# Patient Record
Sex: Female | Born: 1945 | Race: White | Hispanic: No | Marital: Married | Smoking: Former smoker
Health system: Southern US, Community
[De-identification: ages and names within clinical notes are randomized; demographics above are authoritative.]

## PROBLEM LIST (undated history)

## (undated) DIAGNOSIS — I499 Cardiac arrhythmia, unspecified: Secondary | ICD-10-CM

## (undated) DIAGNOSIS — I639 Cerebral infarction, unspecified: Secondary | ICD-10-CM

## (undated) HISTORY — PX: HERNIA REPAIR: SHX51

---

## 2017-12-24 DIAGNOSIS — I482 Chronic atrial fibrillation, unspecified: Secondary | ICD-10-CM | POA: Diagnosis present

## 2018-07-08 ENCOUNTER — Telehealth: Payer: Self-pay

## 2018-07-08 NOTE — Telephone Encounter (Signed)
I am not taking new patients,

## 2018-07-08 NOTE — Telephone Encounter (Signed)
Patient is requesting you for to become her primary care provider. She is new to the area and needs some referrals for occupational therapy.

## 2018-07-12 ENCOUNTER — Ambulatory Visit (INDEPENDENT_AMBULATORY_CARE_PROVIDER_SITE_OTHER): Payer: Medicare Other | Admitting: Physician Assistant

## 2018-07-12 ENCOUNTER — Encounter: Payer: Self-pay | Admitting: Physician Assistant

## 2018-07-12 NOTE — Progress Notes (Signed)
Patient was checked in virtually, name and DOB verified, history taken. In the course of the history, patient reports she sees Dr. Baldemar Lenis for her primary care and intends to keep seeing him, has an appointment scheduled on 09/13/2018. Asked why she wants to establish here and she says because she needs an occupational therapy evaluation so she can be cleared to drive because her neurologist told her she could not. Informed patient this is not an occupational therapy clinic and she'll need to contact her PCP. Confirmed again that patient wishes to continue to see Dr. Baldemar Lenis and she confirms she does. We have mutually decided we will not establish care relationship and she will need to follow up with Dr. Baldemar Lenis for chronic medical conditions.

## 2018-09-13 DIAGNOSIS — I10 Essential (primary) hypertension: Secondary | ICD-10-CM | POA: Diagnosis present

## 2021-03-26 ENCOUNTER — Encounter: Payer: Self-pay | Admitting: Gastroenterology

## 2021-03-26 NOTE — H&P (Signed)
Pre-Procedure H&P   Patient ID: Erica Waters is a 76 y.o. female.  Gastroenterology Provider: Annamaria Helling, DO  PCP: Derinda Late, MD  Date: 03/27/2021  HPI Ms. Erica Waters is a 76 y.o. female who presents today for Esophagogastroduodenoscopy and Colonoscopy for Epigastric pain, reflux, barretts screening; positive Cologuard.  Patient on Eliquis (3d) and cilostazol (5d) currently being held for these procedures.  Patient deals with chronic constipation and feelings of incomplete evacuation for which she uses Dulcolax with good relief.  She has suprapubic discomfort which becomes better with having a bowel movement.  She denies any visible blood or melena with her stools.  She did have a positive Cologuard prior to this procedure.  Patient was also started on MiraLAX during her office visit which has improved her bowel movements, but still with bloating.  Patient currently has reflux that she has managed in the past with Pepcid.  This started to lose effectiveness and she was started on Protonix when seen in the office which has helped her reflux.  She also notes epigastric pain. Denies dysphagia and odynophagia. Describes spasm at the suprasternal notch, but not necessarily associated with eating or drinking and affects her breathing more than her swallowing. Denies nausea and vomiting.  Last checked hemoglobin 13.7 MCV 89 platelets 198,000.  No family history of colorectal polyps or colorectal cancer.  Past Medical History:  Diagnosis Date   Dysrhythmia    afib   Stroke [facility])     Past Surgical History:  Procedure Laterality Date   HERNIA REPAIR     umbilical    Family History No h/o GI disease or malignancy  Review of Systems  Constitutional:  Negative for activity change, appetite change, chills, diaphoresis, fatigue, fever and unexpected weight change.  HENT:  Negative for trouble swallowing and voice change.   Respiratory:  Negative for shortness  of breath and wheezing.   Cardiovascular:  Negative for chest pain, palpitations and leg swelling.  Gastrointestinal:  Positive for abdominal pain and constipation. Negative for abdominal distention, anal bleeding, blood in stool, diarrhea, nausea, rectal pain and vomiting.       Reflux  Musculoskeletal:  Negative for arthralgias and myalgias.  Skin:  Negative for color change and pallor.  Neurological:  Negative for dizziness, syncope and weakness.  Psychiatric/Behavioral:  Negative for confusion.   All other systems reviewed and are negative.   Medications No current facility-administered medications on file prior to encounter.   Current Outpatient Medications on File Prior to Encounter  Medication Sig Dispense Refill   famotidine (PEPCID) 20 MG tablet Take 20 mg by mouth 2 (two) times daily.     lisinopril (ZESTRIL) 20 MG tablet Take by mouth.     metoprolol succinate (TOPROL-XL) 25 MG 24 hr tablet Take 12.5 mg by mouth daily.     simvastatin (ZOCOR) 10 MG tablet Take by mouth.     apixaban (ELIQUIS) 5 MG TABS tablet Take by mouth.     BLACK CURRANT SEED OIL PO Take by mouth. (Patient not taking: Reported on 03/27/2021)     cilostazol (PLETAL) 50 MG tablet Take by mouth.     ezetimibe (ZETIA) 10 MG tablet TAKE 1 TABLET BY MOUTH ONCE DAILY FOR 30 DAYS (Patient not taking: Reported on 03/27/2021)     PARoxetine (PAXIL) 20 MG tablet Take by mouth.     vitamin B-12 (CYANOCOBALAMIN) 1000 MCG tablet Take by mouth. (Patient not taking: Reported on 03/27/2021)  Pertinent medications related to GI and procedure were reviewed by me with the patient prior to the procedure   Current Facility-Administered Medications:    0.9 %  sodium chloride infusion, , Intravenous, Continuous, Annamaria Helling, DO, Last Rate: 20 mL/hr at 03/27/21 0830, New Bag at 03/27/21 0830      Allergies  Allergen Reactions   Penicillin G Hives and Shortness Of Breath   Allergies were reviewed by me prior to  the procedure  Objective    Vitals:   03/27/21 0811  BP: (!) 172/77  Pulse: 66  Resp: 18  Temp: (!) 97.2 F (36.2 C)  TempSrc: Temporal  SpO2: 97%  Weight: 90.7 kg  Height: 5\' 3"  (1.6 m)     Physical Exam Vitals and nursing note reviewed.  Constitutional:      General: She is not in acute distress.    Appearance: Normal appearance. She is not ill-appearing, toxic-appearing or diaphoretic.  HENT:     Head: Normocephalic and atraumatic.     Nose: Nose normal.     Mouth/Throat:     Mouth: Mucous membranes are moist.     Pharynx: Oropharynx is clear.  Eyes:     General: No scleral icterus.    Extraocular Movements: Extraocular movements intact.  Cardiovascular:     Rate and Rhythm: Normal rate and regular rhythm.     Heart sounds: Normal heart sounds. No murmur heard.   No friction rub. No gallop.  Pulmonary:     Effort: Pulmonary effort is normal. No respiratory distress.     Breath sounds: Normal breath sounds. No wheezing, rhonchi or rales.  Abdominal:     General: Bowel sounds are normal. There is no distension.     Palpations: Abdomen is soft.     Tenderness: There is no abdominal tenderness. There is no guarding or rebound.  Musculoskeletal:     Cervical back: Neck supple.     Right lower leg: No edema.     Left lower leg: No edema.  Skin:    General: Skin is warm and dry.     Coloration: Skin is not jaundiced or pale.  Neurological:     General: No focal deficit present.     Mental Status: She is alert and oriented to person, place, and time. Mental status is at baseline.  Psychiatric:        Mood and Affect: Mood normal.        Behavior: Behavior normal.        Thought Content: Thought content normal.        Judgment: Judgment normal.     Assessment:  Erica Waters is a 76 y.o. female  who presents today for Esophagogastroduodenoscopy and Colonoscopy for Epigastric pain, reflux, barretts screening; positive Cologuard.  Plan:   Esophagogastroduodenoscopy and Colonoscopy with possible intervention today  Esophagogastroduodenoscopy and Colonoscopy with possible biopsy, control of bleeding, polypectomy, and interventions as necessary has been discussed with the patient/patient representative. Informed consent was obtained from the patient/patient representative after explaining the indication, nature, and risks of the procedure including but not limited to death, bleeding, perforation, missed neoplasm/lesions, cardiorespiratory compromise, and reaction to medications. Opportunity for questions was given and appropriate answers were provided. Patient/patient representative has verbalized understanding is amenable to undergoing the procedure.   Annamaria Helling, DO  [facility] Gastroenterology  Portions of the record may have been created with voice recognition software. Occasional wrong-word or 'sound-a-like' substitutions may have occurred due to the  inherent limitations of voice recognition software.  Read the chart carefully and recognize, using context, where substitutions may have occurred.

## 2021-03-27 ENCOUNTER — Ambulatory Visit
Admission: RE | Admit: 2021-03-27 | Discharge: 2021-03-27 | Disposition: A | Discharge status: Home / Self Care | Payer: Medicare Other | Attending: Gastroenterology | Admitting: Gastroenterology

## 2021-03-27 ENCOUNTER — Ambulatory Visit: Payer: Medicare Other | Admitting: Certified Registered"

## 2021-03-27 ENCOUNTER — Other Ambulatory Visit: Payer: Self-pay

## 2021-03-27 ENCOUNTER — Encounter
Admission: RE | Disposition: A | Discharge status: Home / Self Care | Payer: Self-pay | Source: Home / Self Care | Attending: Gastroenterology

## 2021-03-27 ENCOUNTER — Encounter: Payer: Self-pay | Admitting: Gastroenterology

## 2021-03-27 DIAGNOSIS — D124 Benign neoplasm of descending colon: Secondary | ICD-10-CM | POA: Diagnosis not present

## 2021-03-27 DIAGNOSIS — K5730 Diverticulosis of large intestine without perforation or abscess without bleeding: Secondary | ICD-10-CM | POA: Diagnosis not present

## 2021-03-27 DIAGNOSIS — Z8673 Personal history of transient ischemic attack (TIA), and cerebral infarction without residual deficits: Secondary | ICD-10-CM | POA: Insufficient documentation

## 2021-03-27 DIAGNOSIS — I4891 Unspecified atrial fibrillation: Secondary | ICD-10-CM | POA: Insufficient documentation

## 2021-03-27 DIAGNOSIS — K2289 Other specified disease of esophagus: Secondary | ICD-10-CM | POA: Diagnosis not present

## 2021-03-27 DIAGNOSIS — K621 Rectal polyp: Secondary | ICD-10-CM | POA: Diagnosis not present

## 2021-03-27 DIAGNOSIS — K219 Gastro-esophageal reflux disease without esophagitis: Secondary | ICD-10-CM | POA: Diagnosis not present

## 2021-03-27 DIAGNOSIS — Z942 Lung transplant status: Secondary | ICD-10-CM | POA: Diagnosis not present

## 2021-03-27 DIAGNOSIS — Z7901 Long term (current) use of anticoagulants: Secondary | ICD-10-CM | POA: Insufficient documentation

## 2021-03-27 DIAGNOSIS — K319 Disease of stomach and duodenum, unspecified: Secondary | ICD-10-CM | POA: Insufficient documentation

## 2021-03-27 DIAGNOSIS — Z79899 Other long term (current) drug therapy: Secondary | ICD-10-CM | POA: Insufficient documentation

## 2021-03-27 DIAGNOSIS — K644 Residual hemorrhoidal skin tags: Secondary | ICD-10-CM | POA: Insufficient documentation

## 2021-03-27 DIAGNOSIS — K5909 Other constipation: Secondary | ICD-10-CM | POA: Diagnosis not present

## 2021-03-27 DIAGNOSIS — K2970 Gastritis, unspecified, without bleeding: Secondary | ICD-10-CM | POA: Diagnosis not present

## 2021-03-27 DIAGNOSIS — R195 Other fecal abnormalities: Secondary | ICD-10-CM | POA: Insufficient documentation

## 2021-03-27 DIAGNOSIS — K648 Other hemorrhoids: Secondary | ICD-10-CM | POA: Diagnosis not present

## 2021-03-27 HISTORY — DX: Cerebral infarction, unspecified: I63.9

## 2021-03-27 HISTORY — PX: ESOPHAGOGASTRODUODENOSCOPY: SHX5428

## 2021-03-27 HISTORY — PX: COLONOSCOPY: SHX5424

## 2021-03-27 HISTORY — DX: Cardiac arrhythmia, unspecified: I49.9

## 2021-03-27 SURGERY — COLONOSCOPY
Anesthesia: General

## 2021-03-27 MED ORDER — LIDOCAINE 2% (20 MG/ML) 5 ML SYRINGE
INTRAMUSCULAR | Status: DC | PRN
  Administered 2021-03-27: 20 mg via INTRAVENOUS

## 2021-03-27 MED ORDER — PROPOFOL 500 MG/50ML IV EMUL
INTRAVENOUS | Status: AC
  Filled 2021-03-27: qty 50

## 2021-03-27 MED ORDER — PROPOFOL 10 MG/ML IV BOLUS
INTRAVENOUS | Status: DC | PRN
Start: 2021-03-27 — End: 2021-03-27
  Administered 2021-03-27: 30 mg via INTRAVENOUS
  Administered 2021-03-27: 70 mg via INTRAVENOUS

## 2021-03-27 MED ORDER — SODIUM CHLORIDE 0.9 % IV SOLN: INTRAVENOUS | Status: DC

## 2021-03-27 MED ORDER — PROPOFOL 500 MG/50ML IV EMUL
INTRAVENOUS | Status: DC | PRN
  Administered 2021-03-27: 120 ug/kg/min via INTRAVENOUS

## 2021-03-27 MED ORDER — GLYCOPYRROLATE 0.2 MG/ML IJ SOLN
INTRAMUSCULAR | Status: DC | PRN
Start: 2021-03-27 — End: 2021-03-27
  Administered 2021-03-27: .2 mg via INTRAVENOUS

## 2021-03-27 NOTE — Interval H&P Note (Signed)
History and Physical Interval Note: Preprocedure H&P from 03/27/21  was reviewed and there was no interval change after seeing and examining the patient.  Written consent was obtained from the patient after discussion of risks, benefits, and alternatives. 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  The various methods of treatment have been discussed with the patient and family. After consideration of risks, benefits and other options for treatment, the patient has consented to  Procedure(s): COLONOSCOPY (N/A) ESOPHAGOGASTRODUODENOSCOPY (EGD) (N/A) as a surgical intervention.  The patient's history has been reviewed, patient examined, no change in status, stable for surgery.  I have reviewed the patient's chart and labs.  Questions were answered to the patient's satisfaction.     Annamaria Helling

## 2021-03-27 NOTE — Op Note (Addendum)
[facility] Gastroenterology Patient Name: Erica Waters Procedure Date: 03/27/2021 9:26 AM MRN: [id number] Account #: [id number] Date of Birth: Mar 14, 1945 Admit Type: Outpatient Age: 76 Room: [facility] ENDO ROOM 1 Gender: Female Note Status: Finalized Instrument Name: Colonoscope [id number] Procedure:             Colonoscopy Indications:           Positive Cologuard test Providers:             Annamaria Helling DO, DO Referring MD:          Caprice Renshaw MD (Referring MD) Medicines:             Monitored Anesthesia Care Complications:         No immediate complications. Estimated blood loss:                         Minimal. Procedure:             Pre-Anesthesia Assessment:                        - Prior to the procedure, a History and Physical was                         performed, and patient medications and allergies were                         reviewed. The patient is competent. The risks and                         benefits of the procedure and the sedation options and                         risks were discussed with the patient. All questions                         were answered and informed consent was obtained.                         Patient identification and proposed procedure were                         verified by the physician, the nurse, the anesthetist                         and the technician in the endoscopy suite. Mental                         Status Examination: alert and oriented. Airway                         Examination: normal oropharyngeal airway and neck                         mobility. Respiratory Examination: clear to                         auscultation. CV Examination: RRR, no murmurs, no S3  or S4. Prophylactic Antibiotics: The patient does not                         require prophylactic antibiotics. Prior                         Anticoagulants: The patient has taken Eliquis                          (apixaban), last dose was 3 days and Pletal 5 days                         prior to procedure. ASA Grade Assessment: III - A                         patient with severe systemic disease. After reviewing                         the risks and benefits, the patient was deemed in                         satisfactory condition to undergo the procedure. The                         anesthesia plan was to use monitored anesthesia care                         (MAC). Immediately prior to administration of                         medications, the patient was re-assessed for adequacy                         to receive sedatives. The heart rate, respiratory                         rate, oxygen saturations, blood pressure, adequacy of                         pulmonary ventilation, and response to care were                         monitored throughout the procedure. The physical                         status of the patient was re-assessed after the                         procedure.                        After obtaining informed consent, the colonoscope was                         passed under direct vision. Throughout the procedure,                         the patient's blood pressure, pulse, and oxygen  saturations were monitored continuously. The                         Colonoscope was introduced through the anus and                         advanced to the the cecum, identified by appendiceal                         orifice and ileocecal valve. The colonoscopy was                         somewhat difficult due to multiple diverticula in the                         colon, a redundant colon, significant looping and the                         patient's body habitus. Successful completion of the                         procedure was aided by changing the patient to a                         supine position, changing the patient to a prone                         position, using  manual pressure, straightening and                         shortening the scope to obtain bowel loop reduction,                         using scope torsion, applying abdominal pressure and                         lavage. The patient tolerated the procedure well. The                         quality of the bowel preparation was evaluated using                         the BBPS [facility] Bowel Preparation Scale) with scores                         of: Right Colon = 3 (entire mucosa seen well with no                         residual staining, small fragments of stool or opaque                         liquid), Transverse Colon = 2 (minor amount of                         residual staining, small fragments of stool and/or  opaque liquid, but mucosa seen well) and Left Colon =                         2 (minor amount of residual staining, small fragments                         of stool and/or opaque liquid, but mucosa seen well).                         The total BBPS score equals 7. The quality of the                         bowel preparation was good. The ileocecal valve,                         appendiceal orifice, and rectum were photographed. Findings:      Hemorrhoids were found on perianal exam.      The digital rectal exam was normal. Pertinent negatives include normal       sphincter tone.      A 5 to 6 mm polyp was found in the descending colon. The polyp was       sessile. The polyp was removed with a cold snare. Resection and       retrieval were complete. Estimated blood loss was minimal.      Two sessile polyps were found in the rectum and descending colon. The       polyps were 1 to 2 mm in size. These polyps were removed with a cold       biopsy forceps. Resection and retrieval were complete. Estimated blood       loss was minimal.      Non-bleeding external and internal hemorrhoids were found during       retroflexion. Estimated blood loss: none.       Multiple small and large-mouthed diverticula were found in the entire       colon. Estimated blood loss: none.      The exam was otherwise without abnormality on direct and retroflexion       views. Impression:            - Hemorrhoids found on perianal exam.                        - One 5 to 6 mm polyp in the descending colon, removed                         with a cold snare. Resected and retrieved.                        - Two 1 to 2 mm polyps in the rectum and in the                         descending colon, removed with a cold biopsy forceps.                         Resected and retrieved.                        - Non-bleeding external and internal hemorrhoids.                        -  Diverticulosis in the entire examined colon.                        - The examination was otherwise normal on direct and                         retroflexion views. Recommendation:        - Discharge patient to home.                        - Resume previous diet.                        - Continue present medications.                        - Await pathology results.                        - Repeat colonoscopy for surveillance based on                         pathology results.                        - Return to GI office as previously scheduled.                        - Resume Eliquis (apixaban) in 2 days and Pletal                         (cilostazol) in 2 days at prior doses. Refer to                         referring physician for further adjustment of therapy. Procedure Code(s):     --- Professional ---                        [phone], Colonoscopy, flexible; with removal of                         tumor(s), polyp(s), or other lesion(s) by snare                         technique                        45380, 74, Colonoscopy, flexible; with biopsy, single                         or multiple Diagnosis Code(s):     --- Professional ---                        K64.8, Other hemorrhoids                         K63.5, Polyp of colon                        K62.1, Rectal polyp  R19.5, Other fecal abnormalities                        K57.30, Diverticulosis of large intestine without                         perforation or abscess without bleeding CPT copyright 2019 American Medical Association. All rights reserved. The codes documented in this report are preliminary and upon coder review may  be revised to meet current compliance requirements. Attending Participation:      I personally performed the entire procedure. Volney American, DO Annamaria Helling DO, DO 03/27/2021 10:43:38 AM This report has been signed electronically. Number of Addenda: 0 Note Initiated On: 03/27/2021 9:26 AM Scope Withdrawal Time: 0 hours 19 minutes 48 seconds  Total Procedure Duration: 0 hours 39 minutes 5 seconds  Estimated Blood Loss:  Estimated blood loss was minimal.      [facility]

## 2021-03-27 NOTE — Anesthesia Preprocedure Evaluation (Signed)
Anesthesia Evaluation  Patient identified by MRN, date of birth, ID band Patient awake    Reviewed: Allergy & Precautions, NPO status , Patient's Chart, lab work & pertinent test results  History of Anesthesia Complications Negative for: history of anesthetic complications  Airway Mallampati: III  TM Distance: >3 FB Neck ROM: Full    Dental  (+) Missing, Poor Dentition, Edentulous Upper   Pulmonary neg pulmonary ROS, neg sleep apnea, neg COPD, Patient abstained from smoking.Not current smoker, former smoker,    Pulmonary exam normal breath sounds clear to auscultation       Cardiovascular Exercise Tolerance: Good METS(-) hypertension(-) CAD and (-) Past MI + dysrhythmias Atrial Fibrillation  Rhythm:Regular Rate:Normal - Systolic murmurs    Neuro/Psych CVA, No Residual Symptoms negative psych ROS   GI/Hepatic neg GERD  ,(+)     (-) substance abuse  ,   Endo/Other  neg diabetes  Renal/GU negative Renal ROS     Musculoskeletal   Abdominal   Peds  Hematology   Anesthesia Other Findings Past Medical History: No date: Dysrhythmia     Comment:  afib No date: Stroke [facility])  Reproductive/Obstetrics                             Anesthesia Physical Anesthesia Plan  ASA: 3  Anesthesia Plan: General   Post-op Pain Management: Minimal or no pain anticipated   Induction: Intravenous  PONV Risk Score and Plan: 3 and Propofol infusion, TIVA and Ondansetron  Airway Management Planned: Nasal Cannula  Additional Equipment: None  Intra-op Plan:   Post-operative Plan:   Informed Consent: I have reviewed the patients History and Physical, chart, labs and discussed the procedure including the risks, benefits and alternatives for the proposed anesthesia with the patient or authorized representative who has indicated his/her understanding and acceptance.     Dental advisory given  Plan  Discussed with: CRNA and Surgeon  Anesthesia Plan Comments: (Discussed risks of anesthesia with patient, including possibility of difficulty with spontaneous ventilation under anesthesia necessitating airway intervention, PONV, and rare risks such as cardiac or respiratory or neurological events, and allergic reactions. Discussed the role of CRNA in patient's perioperative care. Patient understands.)        Anesthesia Quick Evaluation

## 2021-03-27 NOTE — Anesthesia Postprocedure Evaluation (Signed)
Anesthesia Post Note  Patient: Erica Waters  Procedure(s) Performed: COLONOSCOPY ESOPHAGOGASTRODUODENOSCOPY (EGD)  Patient location during evaluation: Endoscopy Anesthesia Type: General Level of consciousness: awake and alert Pain management: pain level controlled Vital Signs Assessment: post-procedure vital signs reviewed and stable Respiratory status: spontaneous breathing, nonlabored ventilation, respiratory function stable and patient connected to nasal cannula oxygen Cardiovascular status: blood pressure returned to baseline and stable Postop Assessment: no apparent nausea or vomiting Anesthetic complications: no   No notable events documented.   Last Vitals:  Vitals:   03/27/21 1053 03/27/21 1113  BP: 140/84 (!) 161/71  Pulse: 71 65  Resp: 19 15  Temp:    SpO2: 97% 99%    Last Pain:  Vitals:   03/27/21 1113  TempSrc:   PainSc: 0-No pain                 Arita Miss

## 2021-03-27 NOTE — Op Note (Addendum)
[facility] Patient Name: Erica Waters Procedure Date: 03/27/2021 9:26 AM MRN: [id number] Account #: [id number] Date of Birth: [birth year] Admit Type: Outpatient Age: 76 Room: [facility] ENDO ROOM 1 Gender: Female Note Status: Finalized Instrument Name: Upper Endoscope [id number] Procedure:             Upper GI endoscopy Indications:           Epigastric abdominal pain Providers:             Annamaria Helling DO, DO Referring MD:          Caprice Renshaw MD (Referring MD) Medicines:             Monitored Anesthesia Care Complications:         No immediate complications. Estimated blood loss:                         Minimal. Procedure:             Pre-Anesthesia Assessment:                        - Prior to the procedure, a History and Physical was                         performed, and patient medications and allergies were                         reviewed. The patient is competent. The risks and                         benefits of the procedure and the sedation options and                         risks were discussed with the patient. All questions                         were answered and informed consent was obtained.                         Patient identification and proposed procedure were                         verified by the physician, the nurse, the anesthetist                         and the technician in the endoscopy suite. Mental                         Status Examination: alert and oriented. Airway                         Examination: normal oropharyngeal airway and neck                         mobility. Respiratory Examination: clear to                         auscultation. CV Examination: RRR, no murmurs, no S3  or S4. Prophylactic Antibiotics: The patient does not                         require prophylactic antibiotics. Prior                         Anticoagulants: The patient has taken Eliquis                          (apixaban) and Pletal (cilastazol), last dose was 3                         days prior to procedure. ASA Grade Assessment: III - A                         patient with severe systemic disease. After reviewing                         the risks and benefits, the patient was deemed in                         satisfactory condition to undergo the procedure. The                         anesthesia plan was to use monitored anesthesia care                         (MAC). Immediately prior to administration of                         medications, the patient was re-assessed for adequacy                         to receive sedatives. The heart rate, respiratory                         rate, oxygen saturations, blood pressure, adequacy of                         pulmonary ventilation, and response to care were                         monitored throughout the procedure. The physical                         status of the patient was re-assessed after the                         procedure.                        After obtaining informed consent, the endoscope was                         passed under direct vision. Throughout the procedure,                         the patient's blood pressure, pulse, and oxygen  saturations were monitored continuously. The Endoscope                         was introduced through the mouth, and advanced to the                         second part of duodenum. The upper GI endoscopy was                         accomplished without difficulty. The patient tolerated                         the procedure well. Findings:      The duodenal bulb, first portion of the duodenum and second portion of       the duodenum were normal. Estimated blood loss: none.      Localized moderate inflammation characterized by erosions and erythema       was found on the lesser curvature of the stomach and in the gastric       antrum. Biopsies were taken with a  cold forceps for Helicobacter pylori       testing. Estimated blood loss was minimal. Estimated blood loss was       minimal.      The Z-line was irregular. Approximately 1cm tongue of salmon colored       mucosa extending above the z line. Biopsies were taken with a cold       forceps for histology. Estimated blood loss was minimal.      Esophagogastric landmarks were identified: the gastroesophageal junction       was found at 38 cm from the incisors.      Normal mucosa was found in the entire esophagus. The scope was       withdrawn. Dilation was performed with a Maloney dilator with no       resistance at 48 Fr. Estimated blood loss: none.      The exam of the esophagus was otherwise normal. Impression:            - Normal duodenal bulb, first portion of the duodenum                         and second portion of the duodenum.                        - Gastritis. Biopsied.                        - Z-line irregular. Biopsied.                        - Esophagogastric landmarks identified.                        - Normal mucosa was found in the entire esophagus.                         Dilated. Recommendation:        - Discharge patient to home.                        - Resume previous diet.                        -  Continue present medications.                        - Recommend increasing protonix 40 mg to twice a day.                        - No ibuprofen, naproxen, or other non-steroidal                         anti-inflammatory drugs.                        - Await pathology results.                        - Return to referring physician as previously                         scheduled. Procedure Code(s):     --- Professional ---                        [phone], Esophagogastroduodenoscopy, flexible,                         transoral; with biopsy, single or multiple                        [id number], Dilation of esophagus, by unguided sound or                         bougie, single or  multiple passes Diagnosis Code(s):     --- Professional ---                        K29.70, Gastritis, unspecified, without bleeding                        K22.8, Other specified diseases of esophagus                        R10.13, Epigastric pain CPT copyright 2019 [employer]. All rights reserved. The codes documented in this report are preliminary and upon coder review may  be revised to meet current compliance requirements. Attending Participation:      I personally performed the entire procedure. Volney American, DO Annamaria Helling DO, DO 03/27/2021 10:36:50 AM This report has been signed electronically. Number of Addenda: 0 Note Initiated On: 03/27/2021 9:26 AM Estimated Blood Loss:  Estimated blood loss was minimal.      [facility]

## 2021-03-27 NOTE — Transfer of Care (Signed)
Immediate Anesthesia Transfer of Care Note  Patient: Erica Waters  Procedure(s) Performed: COLONOSCOPY ESOPHAGOGASTRODUODENOSCOPY (EGD)  Patient Location: Endoscopy Unit  Anesthesia Type:General  Level of Consciousness: drowsy  Airway & Oxygen Therapy: Patient Spontanous Breathing  Post-op Assessment: Report given to RN and Post -op Vital signs reviewed and stable  Post vital signs: Reviewed  Last Vitals:  Vitals Value Taken Time  BP 106/51 03/27/21 1034  Temp    Pulse 71 03/27/21 1034  Resp 12 03/27/21 1034  SpO2 99 % 03/27/21 1034  Vitals shown include unvalidated device data.  Last Pain:  Vitals:   03/27/21 0811  TempSrc: Temporal  PainSc: 0-No pain         Complications: No notable events documented.

## 2021-03-28 ENCOUNTER — Encounter: Payer: Self-pay | Admitting: Gastroenterology

## 2021-03-28 LAB — SURGICAL PATHOLOGY

## 2021-05-16 ENCOUNTER — Other Ambulatory Visit: Payer: Self-pay | Admitting: Family Medicine

## 2021-05-16 DIAGNOSIS — Z1231 Encounter for screening mammogram for malignant neoplasm of breast: Secondary | ICD-10-CM

## 2021-06-23 ENCOUNTER — Encounter: Payer: Self-pay | Admitting: Radiology

## 2021-06-23 ENCOUNTER — Ambulatory Visit
Admission: RE | Admit: 2021-06-23 | Discharge: 2021-06-23 | Disposition: A | Discharge status: Home / Self Care | Payer: Medicare Other | Source: Ambulatory Visit | Attending: Family Medicine | Admitting: Family Medicine

## 2021-06-23 DIAGNOSIS — Z1231 Encounter for screening mammogram for malignant neoplasm of breast: Secondary | ICD-10-CM

## 2021-06-25 ENCOUNTER — Other Ambulatory Visit: Payer: Self-pay | Admitting: Family Medicine

## 2021-06-25 DIAGNOSIS — R928 Other abnormal and inconclusive findings on diagnostic imaging of breast: Secondary | ICD-10-CM

## 2021-06-25 DIAGNOSIS — N63 Unspecified lump in unspecified breast: Secondary | ICD-10-CM

## 2021-07-17 ENCOUNTER — Ambulatory Visit
Admission: RE | Admit: 2021-07-17 | Discharge: 2021-07-17 | Disposition: A | Discharge status: Home / Self Care | Payer: Medicare Other | Source: Ambulatory Visit | Attending: Family Medicine | Admitting: Family Medicine

## 2021-07-17 DIAGNOSIS — R928 Other abnormal and inconclusive findings on diagnostic imaging of breast: Secondary | ICD-10-CM | POA: Insufficient documentation

## 2021-07-17 DIAGNOSIS — N63 Unspecified lump in unspecified breast: Secondary | ICD-10-CM

## 2021-07-21 ENCOUNTER — Other Ambulatory Visit: Payer: Self-pay | Admitting: Family Medicine

## 2021-07-21 DIAGNOSIS — N63 Unspecified lump in unspecified breast: Secondary | ICD-10-CM

## 2021-07-21 DIAGNOSIS — R590 Localized enlarged lymph nodes: Secondary | ICD-10-CM

## 2021-07-21 DIAGNOSIS — R928 Other abnormal and inconclusive findings on diagnostic imaging of breast: Secondary | ICD-10-CM

## 2021-07-28 ENCOUNTER — Other Ambulatory Visit: Payer: Self-pay | Admitting: Family Medicine

## 2021-07-28 DIAGNOSIS — R599 Enlarged lymph nodes, unspecified: Secondary | ICD-10-CM

## 2021-07-28 DIAGNOSIS — R928 Other abnormal and inconclusive findings on diagnostic imaging of breast: Secondary | ICD-10-CM

## 2021-08-07 DIAGNOSIS — C50911 Malignant neoplasm of unspecified site of right female breast: Secondary | ICD-10-CM | POA: Diagnosis present

## 2021-08-12 ENCOUNTER — Ambulatory Visit
Admission: RE | Admit: 2021-08-12 | Discharge: 2021-08-12 | Disposition: A | Discharge status: Home / Self Care | Payer: Medicare Other | Source: Ambulatory Visit | Attending: Family Medicine | Admitting: Family Medicine

## 2021-08-12 DIAGNOSIS — N63 Unspecified lump in unspecified breast: Secondary | ICD-10-CM

## 2021-08-12 DIAGNOSIS — R928 Other abnormal and inconclusive findings on diagnostic imaging of breast: Secondary | ICD-10-CM

## 2021-08-12 DIAGNOSIS — C50911 Malignant neoplasm of unspecified site of right female breast: Secondary | ICD-10-CM | POA: Insufficient documentation

## 2021-08-12 DIAGNOSIS — R599 Enlarged lymph nodes, unspecified: Secondary | ICD-10-CM

## 2021-08-12 HISTORY — PX: BREAST BIOPSY: SHX20

## 2021-08-13 LAB — SURGICAL PATHOLOGY

## 2021-08-20 ENCOUNTER — Other Ambulatory Visit: Payer: Self-pay | Admitting: Surgery

## 2021-08-20 ENCOUNTER — Encounter: Payer: Self-pay | Admitting: *Deleted

## 2021-08-20 DIAGNOSIS — C50911 Malignant neoplasm of unspecified site of right female breast: Secondary | ICD-10-CM

## 2021-08-20 NOTE — Progress Notes (Signed)
Received referral for newly diagnosed breast cancer from Dr. Lysle Pearl.  Navigation initiated.  Med Onc consult scheduled with Dr. Tasia Catchings 08/25/21

## 2021-08-21 ENCOUNTER — Encounter: Payer: Self-pay | Admitting: *Deleted

## 2021-08-21 ENCOUNTER — Telehealth: Payer: Self-pay | Admitting: Oncology

## 2021-08-21 NOTE — Telephone Encounter (Signed)
Called pt to verify request to cancel appt, revcieved in basket. LVM .Marland KitchenKJ

## 2021-08-21 NOTE — Progress Notes (Signed)
Patient called requesting to cancel consultation with Dr. Tasia Catchings, she is going to go to [facility] to be seen.

## 2021-08-25 ENCOUNTER — Inpatient Hospital Stay: Payer: Medicare Other

## 2021-08-25 ENCOUNTER — Inpatient Hospital Stay: Payer: Medicare Other | Admitting: Oncology

## 2021-12-22 ENCOUNTER — Encounter: Payer: Self-pay | Admitting: Emergency Medicine

## 2021-12-22 ENCOUNTER — Emergency Department
Admission: EM | Admit: 2021-12-22 | Discharge: 2021-12-22 | Disposition: A | Discharge status: Home / Self Care | Payer: Medicare Other | Attending: Emergency Medicine | Admitting: Emergency Medicine

## 2021-12-22 ENCOUNTER — Other Ambulatory Visit: Payer: Self-pay

## 2021-12-22 DIAGNOSIS — Z5321 Procedure and treatment not carried out due to patient leaving prior to being seen by health care provider: Secondary | ICD-10-CM | POA: Insufficient documentation

## 2021-12-22 DIAGNOSIS — Z7901 Long term (current) use of anticoagulants: Secondary | ICD-10-CM | POA: Diagnosis not present

## 2021-12-22 DIAGNOSIS — K920 Hematemesis: Secondary | ICD-10-CM | POA: Insufficient documentation

## 2021-12-22 LAB — COMPREHENSIVE METABOLIC PANEL
ALT: 28 U/L (ref 0–44)
AST: 23 U/L (ref 15–41)
Albumin: 3.6 g/dL (ref 3.5–5.0)
Alkaline Phosphatase: 52 U/L (ref 38–126)
Anion gap: 8 (ref 5–15)
BUN: 11 mg/dL (ref 8–23)
CO2: 27 mmol/L (ref 22–32)
Calcium: 9.3 mg/dL (ref 8.9–10.3)
Chloride: 105 mmol/L (ref 98–111)
Creatinine, Ser: 0.98 mg/dL (ref 0.44–1.00)
GFR, Estimated: >60 mL/min (ref >60)
Glucose, Bld: 137 mg/dL — ABNORMAL HIGH (ref 70–99)
Potassium: 4.2 mmol/L (ref 3.5–5.1)
Sodium: 140 mmol/L (ref 135–145)
Total Bilirubin: 0.8 mg/dL (ref 0.3–1.2)
Total Protein: 6.8 g/dL (ref 6.5–8.1)

## 2021-12-22 LAB — TYPE AND SCREEN
ABO/RH(D): O POS
Antibody Screen: NEGATIVE

## 2021-12-22 LAB — CBC
HCT: 38.9 % (ref 36.0–46.0)
Hemoglobin: 12.4 g/dL (ref 12.0–15.0)
MCH: 28.9 pg (ref 26.0–34.0)
MCHC: 31.9 g/dL (ref 30.0–36.0)
MCV: 90.7 fL (ref 80.0–100.0)
Platelets: 206 10*3/uL (ref 150–400)
RBC: 4.29 MIL/uL (ref 3.87–5.11)
RDW: 13.8 % (ref 11.5–15.5)
WBC: 7.8 10*3/uL (ref 4.0–10.5)
nRBC: 0 % (ref 0.0–0.2)

## 2021-12-22 NOTE — ED Triage Notes (Signed)
Pt to ED via POV, pt states that she has been having rectal bleeding today. Pt reports 4 episodes of bloody diarrhea. Pt states that the blood is dark red. Pt has cancer and is currently on PO treatment for it. Pt also takes Eliquis. Pt reports she called her GI doctor but he is out of town.

## 2021-12-22 NOTE — ED Notes (Signed)
Patient made aware she was next bed. Asked to wait. Patient refused. Patient seen ambulating out of ER entrance

## 2021-12-22 NOTE — ED Provider Triage Note (Signed)
Emergency Medicine Provider Triage Evaluation Note  Erica Waters , a 76 y.o. female  was evaluated in triage.  Pt complains of dark blood with bowel movement today. On Eliquis and plavix. No abdominal pain or vomiting. Currently on chemo for breast and lung cancer.   Physical Exam  BP (!) 125/39 (BP Location: Left Arm)   Pulse 78   Temp 97.8 F (36.6 C) (Oral)   Resp 16   SpO2 95%  Gen:   Awake, no distress   Resp:  Normal effort  MSK:   Moves extremities without difficulty  Other:    Medical Decision Making  Medically screening exam initiated at 3:01 PM.  Appropriate orders placed.  Erica Waters was informed that the remainder of the evaluation will be completed by another provider, this initial triage assessment does not replace that evaluation, and the importance of remaining in the ED until their evaluation is complete.   Victorino Dike, FNP 12/22/21 1502

## 2022-03-13 ENCOUNTER — Other Ambulatory Visit: Payer: Self-pay

## 2022-03-13 ENCOUNTER — Inpatient Hospital Stay
Admission: EM | Admit: 2022-03-13 | Discharge: 2022-04-09 | DRG: 208 | Disposition: E | Payer: Medicare Other | Attending: Pulmonary Disease | Admitting: Pulmonary Disease

## 2022-03-13 ENCOUNTER — Emergency Department: Payer: Medicare Other

## 2022-03-13 DIAGNOSIS — Z170 Estrogen receptor positive status [ER+]: Secondary | ICD-10-CM

## 2022-03-13 DIAGNOSIS — F329 Major depressive disorder, single episode, unspecified: Secondary | ICD-10-CM | POA: Diagnosis present

## 2022-03-13 DIAGNOSIS — D630 Anemia in neoplastic disease: Secondary | ICD-10-CM | POA: Diagnosis present

## 2022-03-13 DIAGNOSIS — C50911 Malignant neoplasm of unspecified site of right female breast: Secondary | ICD-10-CM | POA: Diagnosis present

## 2022-03-13 DIAGNOSIS — C773 Secondary and unspecified malignant neoplasm of axilla and upper limb lymph nodes: Secondary | ICD-10-CM | POA: Diagnosis present

## 2022-03-13 DIAGNOSIS — R4020 Unspecified coma: Secondary | ICD-10-CM | POA: Diagnosis not present

## 2022-03-13 DIAGNOSIS — Z9981 Dependence on supplemental oxygen: Secondary | ICD-10-CM | POA: Diagnosis not present

## 2022-03-13 DIAGNOSIS — J849 Interstitial pulmonary disease, unspecified: Secondary | ICD-10-CM | POA: Diagnosis present

## 2022-03-13 DIAGNOSIS — R5381 Other malaise: Secondary | ICD-10-CM | POA: Diagnosis present

## 2022-03-13 DIAGNOSIS — Z880 Allergy status to penicillin: Secondary | ICD-10-CM

## 2022-03-13 DIAGNOSIS — E785 Hyperlipidemia, unspecified: Secondary | ICD-10-CM | POA: Diagnosis present

## 2022-03-13 DIAGNOSIS — Z20822 Contact with and (suspected) exposure to covid-19: Secondary | ICD-10-CM | POA: Diagnosis present

## 2022-03-13 DIAGNOSIS — G8929 Other chronic pain: Secondary | ICD-10-CM | POA: Diagnosis present

## 2022-03-13 DIAGNOSIS — E871 Hypo-osmolality and hyponatremia: Secondary | ICD-10-CM | POA: Diagnosis present

## 2022-03-13 DIAGNOSIS — Z515 Encounter for palliative care: Secondary | ICD-10-CM | POA: Diagnosis not present

## 2022-03-13 DIAGNOSIS — J189 Pneumonia, unspecified organism: Secondary | ICD-10-CM | POA: Diagnosis present

## 2022-03-13 DIAGNOSIS — I482 Chronic atrial fibrillation, unspecified: Secondary | ICD-10-CM | POA: Diagnosis present

## 2022-03-13 DIAGNOSIS — Z6836 Body mass index (BMI) 36.0-36.9, adult: Secondary | ICD-10-CM

## 2022-03-13 DIAGNOSIS — X58XXXA Exposure to other specified factors, initial encounter: Secondary | ICD-10-CM | POA: Diagnosis present

## 2022-03-13 DIAGNOSIS — Z8673 Personal history of transient ischemic attack (TIA), and cerebral infarction without residual deficits: Secondary | ICD-10-CM

## 2022-03-13 DIAGNOSIS — N179 Acute kidney failure, unspecified: Secondary | ICD-10-CM

## 2022-03-13 DIAGNOSIS — Z85118 Personal history of other malignant neoplasm of bronchus and lung: Secondary | ICD-10-CM

## 2022-03-13 DIAGNOSIS — Z9221 Personal history of antineoplastic chemotherapy: Secondary | ICD-10-CM

## 2022-03-13 DIAGNOSIS — I739 Peripheral vascular disease, unspecified: Secondary | ICD-10-CM | POA: Diagnosis present

## 2022-03-13 DIAGNOSIS — I10 Essential (primary) hypertension: Secondary | ICD-10-CM | POA: Diagnosis present

## 2022-03-13 DIAGNOSIS — J9601 Acute respiratory failure with hypoxia: Principal | ICD-10-CM

## 2022-03-13 DIAGNOSIS — Z87891 Personal history of nicotine dependence: Secondary | ICD-10-CM | POA: Diagnosis not present

## 2022-03-13 DIAGNOSIS — Z7901 Long term (current) use of anticoagulants: Secondary | ICD-10-CM

## 2022-03-13 DIAGNOSIS — E669 Obesity, unspecified: Secondary | ICD-10-CM | POA: Diagnosis present

## 2022-03-13 DIAGNOSIS — J9602 Acute respiratory failure with hypercapnia: Secondary | ICD-10-CM | POA: Diagnosis present

## 2022-03-13 DIAGNOSIS — Z66 Do not resuscitate: Secondary | ICD-10-CM | POA: Diagnosis not present

## 2022-03-13 DIAGNOSIS — I700 Atherosclerosis of aorta: Secondary | ICD-10-CM | POA: Diagnosis present

## 2022-03-13 DIAGNOSIS — E872 Acidosis, unspecified: Secondary | ICD-10-CM | POA: Diagnosis present

## 2022-03-13 DIAGNOSIS — E876 Hypokalemia: Secondary | ICD-10-CM | POA: Diagnosis present

## 2022-03-13 DIAGNOSIS — T451X5A Adverse effect of antineoplastic and immunosuppressive drugs, initial encounter: Secondary | ICD-10-CM | POA: Diagnosis present

## 2022-03-13 LAB — CBC
HCT: 34.4 % — ABNORMAL LOW (ref 36.0–46.0)
Hemoglobin: 11.1 g/dL — ABNORMAL LOW (ref 12.0–15.0)
MCH: 30.7 pg (ref 26.0–34.0)
MCHC: 32.3 g/dL (ref 30.0–36.0)
MCV: 95 fL (ref 80.0–100.0)
Platelets: 322 10*3/uL (ref 150–400)
RBC: 3.62 MIL/uL — ABNORMAL LOW (ref 3.87–5.11)
RDW: 15.6 % — ABNORMAL HIGH (ref 11.5–15.5)
WBC: 8.2 10*3/uL (ref 4.0–10.5)
nRBC: 0 % (ref 0.0–0.2)

## 2022-03-13 LAB — BASIC METABOLIC PANEL
Anion gap: 12 (ref 5–15)
BUN: 12 mg/dL (ref 8–23)
CO2: 18 mmol/L — ABNORMAL LOW (ref 22–32)
Calcium: 8.6 mg/dL — ABNORMAL LOW (ref 8.9–10.3)
Chloride: 98 mmol/L (ref 98–111)
Creatinine, Ser: 1.46 mg/dL — ABNORMAL HIGH (ref 0.44–1.00)
GFR, Estimated: 37 mL/min — ABNORMAL LOW (ref >60)
Glucose, Bld: 133 mg/dL — ABNORMAL HIGH (ref 70–99)
Potassium: 3.5 mmol/L (ref 3.5–5.1)
Sodium: 128 mmol/L — ABNORMAL LOW (ref 135–145)

## 2022-03-13 LAB — LACTIC ACID, PLASMA: Lactic Acid, Venous: 1.4 mmol/L (ref 0.5–1.9)

## 2022-03-13 LAB — RESP PANEL BY RT-PCR (RSV, FLU A&B, COVID)  RVPGX2
Influenza A by PCR: NEGATIVE
Influenza B by PCR: NEGATIVE
Resp Syncytial Virus by PCR: NEGATIVE
SARS Coronavirus 2 by RT PCR: NEGATIVE

## 2022-03-13 LAB — BRAIN NATRIURETIC PEPTIDE: B Natriuretic Peptide: 27.2 pg/mL (ref 0.0–100.0)

## 2022-03-13 LAB — TROPONIN I (HIGH SENSITIVITY): Troponin I (High Sensitivity): 21 ng/L — ABNORMAL HIGH (ref <18)

## 2022-03-13 MED ORDER — ACETAMINOPHEN 325 MG PO TABS
650.0000 mg | ORAL_TABLET | Freq: Four times a day (QID) | ORAL | Status: DC | PRN
  Administered 2022-03-15: 650 mg via ORAL
  Filled 2022-03-13: qty 2

## 2022-03-13 MED ORDER — SIMVASTATIN 20 MG PO TABS
10.0000 mg | ORAL_TABLET | Freq: Every day | ORAL | Status: DC
  Administered 2022-03-14 – 2022-03-17 (×3): 10 mg via ORAL
  Filled 2022-03-13 (×3): qty 1

## 2022-03-13 MED ORDER — IOHEXOL 350 MG/ML SOLN
75.0000 mL | Freq: Once | INTRAVENOUS | Status: AC | PRN
  Administered 2022-03-13: 75 mL via INTRAVENOUS

## 2022-03-13 MED ORDER — GUAIFENESIN-DM 100-10 MG/5ML PO SYRP
10.0000 mL | ORAL_SOLUTION | ORAL | Status: DC | PRN
  Administered 2022-03-16: 10 mL via ORAL
  Filled 2022-03-13: qty 10

## 2022-03-13 MED ORDER — ACETAMINOPHEN 650 MG RE SUPP
650.0000 mg | Freq: Four times a day (QID) | RECTAL | Status: DC | PRN
  Administered 2022-03-16: 650 mg via RECTAL
  Filled 2022-03-13 (×2): qty 1

## 2022-03-13 MED ORDER — ONDANSETRON HCL 4 MG PO TABS: 4.0000 mg | ORAL_TABLET | Freq: Four times a day (QID) | ORAL | Status: DC | PRN

## 2022-03-13 MED ORDER — LEVOFLOXACIN IN D5W 750 MG/150ML IV SOLN
750.0000 mg | Freq: Once | INTRAVENOUS | Status: AC
  Administered 2022-03-14: 750 mg via INTRAVENOUS
  Filled 2022-03-13: qty 150

## 2022-03-13 MED ORDER — ONDANSETRON HCL 4 MG/2ML IJ SOLN
4.0000 mg | Freq: Four times a day (QID) | INTRAMUSCULAR | Status: DC | PRN
  Administered 2022-03-14: 4 mg via INTRAVENOUS
  Filled 2022-03-13: qty 2

## 2022-03-13 MED ORDER — APIXABAN 5 MG PO TABS
5.0000 mg | ORAL_TABLET | Freq: Two times a day (BID) | ORAL | Status: DC
  Administered 2022-03-14 – 2022-03-18 (×10): 5 mg via ORAL
  Filled 2022-03-13 (×10): qty 1

## 2022-03-13 MED ORDER — ALBUTEROL SULFATE (2.5 MG/3ML) 0.083% IN NEBU
2.5000 mg | INHALATION_SOLUTION | RESPIRATORY_TRACT | Status: DC | PRN
  Administered 2022-03-15 – 2022-03-16 (×2): 2.5 mg via RESPIRATORY_TRACT
  Filled 2022-03-13 (×2): qty 3

## 2022-03-13 MED ORDER — METOPROLOL SUCCINATE ER 25 MG PO TB24
12.5000 mg | ORAL_TABLET | Freq: Every day | ORAL | Status: DC
  Administered 2022-03-14 – 2022-03-17 (×4): 12.5 mg via ORAL
  Filled 2022-03-13: qty 1
  Filled 2022-03-13: qty 0.5
  Filled 2022-03-13 (×2): qty 1

## 2022-03-13 MED ORDER — SODIUM CHLORIDE 0.9 % IV SOLN: INTRAVENOUS | Status: AC

## 2022-03-13 MED ORDER — SODIUM CHLORIDE 0.9 % IV BOLUS
1000.0000 mL | Freq: Once | INTRAVENOUS | Status: AC
  Administered 2022-03-13: 1000 mL via INTRAVENOUS

## 2022-03-13 MED ORDER — LISINOPRIL 5 MG PO TABS
20.0000 mg | ORAL_TABLET | Freq: Every day | ORAL | Status: DC
  Administered 2022-03-14 – 2022-03-16 (×3): 20 mg via ORAL
  Filled 2022-03-13: qty 2
  Filled 2022-03-13 (×2): qty 1
  Filled 2022-03-13: qty 4

## 2022-03-13 NOTE — ED Provider Triage Note (Signed)
  Emergency Medicine Provider Triage Evaluation Note  Erica Waters , a 77 y.o.female,  was evaluated in triage.  Pt complains of cough/shortness of breath.  Patient was brought in via EMS and found to be 60% on room air by fire department.  She states that she has had a persistent cough for the past 2 weeks.  She reports a history of stage IV lung and breast cancer and is currently on chemotherapy.   Review of Systems  Positive: Cough, shortness of breath. Negative: Denies fever, chest pain, vomiting  Physical Exam   Vitals:   03/28/2022 1458 03/23/2022 1501  BP: (!) 112/49   Pulse: 99   Resp:  20  Temp: 98.3 F (36.8 C)   SpO2: (!) 87%    Gen:   Awake, no distress   Resp:  Normal effort  MSK:   Moves extremities without difficulty  Other:    Medical Decision Making  Given the patient's initial medical screening exam, the following diagnostic evaluation has been ordered. The patient will be placed in the appropriate treatment space, once one is available, to complete the evaluation and treatment. I have discussed the plan of care with the patient and I have advised the patient that an ED physician or mid-level practitioner will reevaluate their condition after the test results have been received, as the results may give them additional insight into the type of treatment they may need.    Diagnostics: Labs, EKG, CXR, respiratory panel.  Treatments: none immediately   Teodoro Spray, [place] 03/15/2022 1629

## 2022-03-13 NOTE — ED Triage Notes (Signed)
Pt to ED via [facility]. Pt was found to be 60s on RA by FD. Pt was placed on 4L [place]. EMS brought pt in on 3L [place]. Pt 89% on 3L [place], RN increased to 4L [place]. Pt states stage 4 lung and breast cancer. Pt states on chemo pill. Pt denies oxygen use at home.

## 2022-03-13 NOTE — Assessment & Plan Note (Signed)
-   Continue Eliquis and metoprolol 

## 2022-03-13 NOTE — Assessment & Plan Note (Signed)
Continue lisinopril and metoprolol ?

## 2022-03-13 NOTE — ED Notes (Signed)
Pure wick placed.

## 2022-03-13 NOTE — ED Notes (Signed)
Pt returned from CT °

## 2022-03-13 NOTE — ED Triage Notes (Signed)
Pt comes via EMs with c/o cough for two weeks. Per UC pt's O2 was in 72s. Pt was placed on O2 at 4L. Fire turned up to [employer]. Pt hands are ice cold per EMs.  O2 stayed on at 3L and stayed at 90%  180/90 Pt has been tested for covid and flu and it was neg

## 2022-03-13 NOTE — ED Provider Notes (Signed)
[facility] Provider Note    Event Date/Time   First MD Initiated Contact with Patient 03/12/2022 2142     (approximate)   History   Cough   HPI  Erica Waters is a 77 y.o. female with past medical history of breast cancer with metastasis, fib on Eliquis, hypertension who presents with shortness of breath.  Patient has had dry cough for about 2 weeks.  Felt increasingly short of breath today and went to urgent care where apparently her pulse ox was in the 60s.  Placed on 6 L nasal cannula here.  Patient denies lower extremity swelling denies fevers chills.  Has felt somewhat weak.  She is currently on p.o. chemo for her metastatic breast cancer.  Denies history of DVT/PE.  She is on Eliquis for A-fib and is compliant with this.  Has had decreased appetite for several days as well.     Past Medical History:  Diagnosis Date   Dysrhythmia    afib   Stroke [facility])     There are no problems to display for this patient.    Physical Exam  Triage Vital Signs: ED Triage Vitals  Enc Vitals Group     BP 03/31/2022 1458 (!) 112/49     Pulse Rate 04/06/2022 1458 99     Resp 03/10/2022 1501 20     Temp 03/12/2022 1458 98.3 F (36.8 C)     Temp Source 03/18/2022 1458 Oral     SpO2 03/31/2022 1458 (!) 87 %     Weight --      Height --      Head Circumference --      Peak Flow --      Pain Score 03/28/2022 1422 0     Pain Loc --      Pain Edu? --      Excl. in [place]? --     Most recent vital signs: Vitals:   03/27/2022 2215 03/17/2022 2238  BP: 122/83   Pulse: 79   Resp: 20   Temp:  98.2 F (36.8 C)  SpO2: 95%      General: Awake, no distress.  CV:  Good peripheral perfusion.  No peripheral edema Resp:  Normal effort. Lungs are clear Abd:  No distention.  Neuro:             Awake, Alert, Oriented x 3  Other:     ED Results / Procedures / Treatments  Labs (all labs ordered are listed, but only abnormal results are displayed) Labs Reviewed  CBC - Abnormal;  Notable for the following components:      Result Value   RBC 3.62 (*)    Hemoglobin 11.1 (*)    HCT 34.4 (*)    RDW 15.6 (*)    All other components within normal limits  BASIC METABOLIC PANEL - Abnormal; Notable for the following components:   Sodium 128 (*)    CO2 18 (*)    Glucose, Bld 133 (*)    Creatinine, Ser 1.46 (*)    Calcium 8.6 (*)    GFR, Estimated 37 (*)    All other components within normal limits  TROPONIN I (HIGH SENSITIVITY) - Abnormal; Notable for the following components:   Troponin I (High Sensitivity) 21 (*)    All other components within normal limits  RESP PANEL BY RT-PCR (RSV, FLU A&B, COVID)  RVPGX2  CULTURE, BLOOD (ROUTINE X 2)  CULTURE, BLOOD (ROUTINE X 2)  BRAIN NATRIURETIC  PEPTIDE  LACTIC ACID, PLASMA  PROCALCITONIN  LACTIC ACID, PLASMA  STREP PNEUMONIAE URINARY ANTIGEN  LEGIONELLA PNEUMOPHILA SEROGP 1 UR AG     EKG  EKG interpretation performed by myself: NSR, nml axis, nml intervals, no acute ischemic changes    RADIOLOGY    PROCEDURES:  Critical Care performed: Yes, see critical care procedure note(s)  .1-3 Lead EKG Interpretation  Performed by: Rada Hay, MD Authorized by: Rada Hay, MD     Interpretation: normal     ECG rate assessment: normal     Rhythm: sinus rhythm     Ectopy: none     Conduction: normal   .Critical Care  Performed by: Rada Hay, MD Authorized by: Rada Hay, MD   Critical care provider statement:    Critical care time (minutes):  30   Critical care was time spent personally by me on the following activities:  Development of treatment plan with patient or surrogate, discussions with consultants, evaluation of patient's response to treatment, examination of patient, ordering and review of laboratory studies, ordering and review of radiographic studies, ordering and performing treatments and interventions, pulse oximetry, re-evaluation of patient's condition and review of  old charts   The patient is on the cardiac monitor to evaluate for evidence of arrhythmia and/or significant heart rate changes.   MEDICATIONS ORDERED IN ED: Medications  levofloxacin (LEVAQUIN) IVPB 750 mg (has no administration in time range)  sodium chloride 0.9 % bolus 1,000 mL (1,000 mLs Intravenous New Bag/Given 03/10/2022 2249)  iohexol (OMNIPAQUE) 350 MG/ML injection 75 mL (75 mLs Intravenous Contrast Given 03/29/2022 2303)     IMPRESSION / MDM / ASSESSMENT AND PLAN / ED COURSE  I reviewed the triage vital signs and the nursing notes.                              Patient's presentation is most consistent with acute presentation with potential threat to life or bodily function.  Differential diagnosis includes, but is not limited to, pneumonia, pulmonary embolism, pleural effusion, pneumothorax, CHF  Patient is a 78 year old female with metastatic breast cancer on oral chemo who presents with shortness of breath.  She has had dry cough and shortness of breath for 2 weeks primarily dyspnea on exertion no clear PND or orthopnea no lower extremity swelling.  She has not had fevers chills.  Went to urgent care today and was found to be profoundly hypoxic with sats in the 60s.  She is satting 87% on arrival to ED.  Placed on 4 L nasal cannula.  On my evaluation overall she looks well she is not in respiratory distress lungs overall sound clear no peripheral edema.  Chest x-ray looks like coarse edema could be mass versus pulmonary edema versus multifocal pneumonia.  Given history of malignancy and somewhat unclear chest x-ray I obtained a CTA.  Patient's labs overall reassuring has no leukocytosis negative BNP, just mildly elevated troponin at 21.  COVID and flu testing is negative.  CTA results read as likely multifocal pneumonia.  Atypical viral processes possible.  Patient is negative for COVID and flu.  Tells me she has history of hives and shortness of breath and response to penicillins.  Has  not had ceftriaxone that she is aware of and I cannot find that she has had a cephalosporin.  Will cover with Levaquin.  Patient will require admission.      FINAL CLINICAL IMPRESSION(S) /  ED DIAGNOSES   Final diagnoses:  Acute respiratory failure with hypoxia (HCC)  Pneumonia of both lungs due to infectious organism, unspecified part of lung     Rx / DC Orders   ED Discharge Orders     None        Note:  This document was prepared using Dragon voice recognition software and may include unintentional dictation errors.   Rada Hay, MD 04/07/2022 2330

## 2022-03-13 NOTE — Assessment & Plan Note (Signed)
Metabolic acidosis Hyponatremia Suspect prerenal and related to poor oral intake from acute illness IV hydration with NS Monitor renal function and serum sodium

## 2022-03-13 NOTE — Assessment & Plan Note (Signed)
Patient is on oral chemotherapy Continue pending med rec

## 2022-03-13 NOTE — H&P (Signed)
History and Physical    Patient: Erica Waters WUJ:[id number] DOB: 1945-04-25 DOA: 03/24/2022 DOS: the patient was seen and examined on 03/28/2022 PCP: Derinda Late, MD  Patient coming from: Home  Chief Complaint:  Chief Complaint  Patient presents with   Cough    HPI: Erica Waters is a 77 y.o. 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 and a former smoker who presents to the ED from urgent care for evaluation of hypoxia.  She presented there with a 2-week history of a dry cough and shortness of breath and generalized malaise and was found to be hypoxic to the 60s.  She was placed on O2 at 3 L with improvement to the 90s and sent to the ED.  Patient denies chest pain, lower extremity pain or swelling and denies fever or chills and denies abdominal pain, diarrhea or dysuria.  She is compliant with her Eliquis. ED course and data review: afebrile with pulse in the 70s to 80s, respirations 22, BP 112/49 and O2 sat initially 87% on 3 L improving to high 90s on 4 L.  WBC 8200 with lactic acid 1.4.  Respiratory viral panel negative for COVID flu and RSV.  Troponin 21 and BNP 27.  BMP significant for creatinine of 1.46 above baseline of 0.98, with bicarb of 18 and sodium 128.  EKG, personally viewed and interpreted showing NSR at 96 with no acute ST-T wave changes. CTA chest negative for PE and showing multifocal pneumonia versus metastatic disease as described below: IMPRESSION: 1. No evidence of pulmonary embolus. 2. Multifocal bilateral airspace disease, favor pneumonia over metastatic disease. Atypical viral etiologies such as COVID-19 should be considered based on pattern of findings. 3. Nodular thickening left adrenal gland, concerning for metastatic disease in a patient with a history of breast and lung cancer. 4.  Aortic Atherosclerosis  (ICD10-I70.0).  Patient started on Levaquin due to history of severe penicillin allergy and given an IV fluid bolus of NS and hospitalist consulted for admission.   Review of Systems: As mentioned in the history of present illness. All other systems reviewed and are negative.  Past Medical History:  Diagnosis Date   Dysrhythmia    afib   Stroke [facility])    Past Surgical History:  Procedure Laterality Date   BREAST BIOPSY Right 08/12/2021   [place] bx 10:00 coil marker, [place] bx 12:00 ribbon marker, [place] bx axilla LN, hydro marker, path pending   COLONOSCOPY N/A 03/27/2021   Procedure: COLONOSCOPY;  Surgeon: Annamaria Helling, DO;  Location: [place];  Service: Gastroenterology;  Laterality: N/A;   ESOPHAGOGASTRODUODENOSCOPY N/A 03/27/2021   Procedure: ESOPHAGOGASTRODUODENOSCOPY (EGD);  Surgeon: Annamaria Helling, DO;  Location: [facility] ENDOSCOPY;  Service: Gastroenterology;  Laterality: N/A;   HERNIA REPAIR     umbilical   Social History:  reports that she quit smoking about 4 years ago. Her smoking use included cigarettes. She has a 22.50 pack-year smoking history. She does not have any smokeless tobacco history on file. She reports that she does not use drugs. No history on file for alcohol use.  Allergies  Allergen Reactions   Penicillin G Hives and Shortness Of Breath    Family History  Problem Relation Age of Onset   Breast cancer Neg Hx     Prior to Admission medications   Medication Sig Start Date End Date Taking? Authorizing Provider  apixaban (ELIQUIS) 5 MG  TABS tablet Take by mouth. 01/14/18   [provider]  BLACK CURRANT SEED OIL PO Take by mouth. Patient not taking: Reported on 03/27/2021    [provider]  cilostazol (PLETAL) 50 MG tablet Take by mouth. 05/29/17   [provider]  ezetimibe (ZETIA) 10 MG tablet TAKE 1 TABLET BY MOUTH ONCE DAILY FOR 30 DAYS Patient not taking: Reported on 03/27/2021 11/03/17   [provider]   famotidine (PEPCID) 20 MG tablet Take 20 mg by mouth 2 (two) times daily.    [provider]  lisinopril (ZESTRIL) 20 MG tablet Take by mouth. 01/25/18   [provider]  metoprolol succinate (TOPROL-XL) 25 MG 24 hr tablet Take 12.5 mg by mouth daily.    [provider]  PARoxetine (PAXIL) 20 MG tablet Take by mouth. 03/04/18 03/04/19  [provider]  simvastatin (ZOCOR) 10 MG tablet Take by mouth. 12/27/17   [provider]  vitamin B-12 (CYANOCOBALAMIN) 1000 MCG tablet Take by mouth. Patient not taking: Reported on 03/27/2021    [provider]    Physical Exam: Vitals:   04/07/2022 2201 03/21/2022 2202 03/31/2022 2215 04/03/2022 2238  BP:   122/83   Pulse: 81 80 79   Resp: (!) 22 (!) 21 20   Temp:    98.2 F (36.8 C)  TempSrc:    Oral  SpO2: 97% 97% 95%    Physical Exam Vitals and nursing note reviewed.  Constitutional:      General: She is not in acute distress.    Interventions: Nasal cannula in place.     Comments: Tachypneic, unable to speak in full sentences  HENT:     Head: Normocephalic and atraumatic.  Cardiovascular:     Rate and Rhythm: Normal rate and regular rhythm.     Heart sounds: Normal heart sounds.  Pulmonary:     Effort: Tachypnea present.     Breath sounds: Wheezing and rales present.  Abdominal:     Palpations: Abdomen is soft.     Tenderness: There is no abdominal tenderness.  Neurological:     Mental Status: Mental status is at baseline.     Labs on Admission: I have personally reviewed following labs and imaging studies  CBC: Recent Labs  Lab 03/21/2022 1424  WBC 8.2  HGB 11.1*  HCT 34.4*  MCV 95.0  PLT 427   Basic Metabolic Panel: Recent Labs  Lab 03/14/2022 1424  NA 128*  K 3.5  CL 98  CO2 18*  GLUCOSE 133*  BUN 12  CREATININE 1.46*  CALCIUM 8.6*   GFR: CrCl cannot be calculated (Unknown ideal weight.). Liver Function Tests: No results for input(s): "AST", "ALT", "ALKPHOS",  "BILITOT", "PROT", "ALBUMIN" in the last 168 hours. No results for input(s): "LIPASE", "AMYLASE" in the last 168 hours. No results for input(s): "AMMONIA" in the last 168 hours. Coagulation Profile: No results for input(s): "INR", "PROTIME" in the last 168 hours. Cardiac Enzymes: No results for input(s): "CKTOTAL", "CKMB", "CKMBINDEX", "TROPONINI" in the last 168 hours. BNP (last 3 results) No results for input(s): "PROBNP" in the last 8760 hours. HbA1C: No results for input(s): "HGBA1C" in the last 72 hours. CBG: No results for input(s): "GLUCAP" in the last 168 hours. Lipid Profile: No results for input(s): "CHOL", "HDL", "LDLCALC", "TRIG", "CHOLHDL", "LDLDIRECT" in the last 72 hours. Thyroid Function Tests: No results for input(s): "TSH", "T4TOTAL", "FREET4", "T3FREE", "THYROIDAB" in the last 72 hours. Anemia Panel: No results for input(s): "VITAMINB12", "FOLATE", "  FERRITIN", "TIBC", "IRON", "RETICCTPCT" in the last 72 hours. Urine analysis: No results found for: "COLORURINE", "APPEARANCEUR", "LABSPEC", "PHURINE", "GLUCOSEU", "HGBUR", "BILIRUBINUR", "KETONESUR", "PROTEINUR", "UROBILINOGEN", "NITRITE", "LEUKOCYTESUR"  Radiological Exams on Admission: CT Angio Chest PE W and/or Wo Contrast  Result Date: 03/23/2022 CLINICAL DATA:  Cough for 2 weeks, hypoxia, history of stage IV lung and breast cancer EXAM: CT ANGIOGRAPHY CHEST WITH CONTRAST TECHNIQUE: Multidetector CT imaging of the chest was performed using the standard protocol during bolus administration of intravenous contrast. Multiplanar CT image reconstructions and MIPs were obtained to evaluate the vascular anatomy. RADIATION DOSE REDUCTION: This exam was performed according to the departmental dose-optimization program which includes automated exposure control, adjustment of the mA and/or kV according to patient size and/or use of iterative reconstruction technique. CONTRAST:  45m OMNIPAQUE IOHEXOL 350 MG/ML SOLN COMPARISON:   03/28/2022 FINDINGS: Cardiovascular: This is a technically adequate evaluation of the pulmonary vasculature. No filling defects or pulmonary emboli. The heart is unremarkable without pericardial effusion. No evidence of thoracic aortic aneurysm or dissection. Atherosclerosis of the aorta and coronary vasculature. Mediastinum/Nodes: No enlarged mediastinal, hilar, or axillary lymph nodes. Thyroid gland, trachea, and esophagus demonstrate no significant findings. Small hiatal hernia. Lungs/Pleura: There is diffuse increased interstitial prominence, with multifocal bilateral airspace disease as seen on preceding chest x-ray. I would favor multifocal bilateral pneumonia such as COVID-19 over metastatic disease. No effusion or pneumothorax. Central airways are patent. Upper Abdomen: Nodular thickening of the left adrenal gland measuring up to 18 mm, suspicious for metastatic disease in a patient with a history of lung and breast cancer. No other acute upper abdominal findings. Musculoskeletal: No acute or destructive bony lesions. Reconstructed images demonstrate no additional findings. Review of the MIP images confirms the above findings. IMPRESSION: 1. No evidence of pulmonary embolus. 2. Multifocal bilateral airspace disease, favor pneumonia over metastatic disease. Atypical viral etiologies such as COVID-19 should be considered based on pattern of findings. 3. Nodular thickening left adrenal gland, concerning for metastatic disease in a patient with a history of breast and lung cancer. 4.  Aortic Atherosclerosis (ICD10-I70.0). Electronically Signed   By: MRanda NgoM.D.   On: 03/26/2022 23:24   DG Chest 2 View  Result Date: 03/31/2022 CLINICAL DATA:  Cough. Decreased oxygen saturation. History of stage IV lung and breast cancer. EXAM: CHEST - 2 VIEW COMPARISON:  None Available. FINDINGS: Heart size and mediastinal contours are unremarkable. Aortic atherosclerotic calcifications. Bilateral upper and lower lobe  airspace opacities with diffuse increase interstitial markings identified. No signs of pleural effusion. Visualized osseous structures appear intact. IMPRESSION: Bilateral upper and lower lobe airspace opacities with diffuse increase interstitial markings. In the acute setting, imaging findings are concerning for multifocal pneumonia with superimposed interstitial edema. However, in the setting of stage IV lung cancer and breast cancer findings are less specific and may also be seen with diffuse pulmonary metastasis. Electronically Signed   By: TKerby MoorsM.D.   On: 04/08/2022 14:55     Data Reviewed: Relevant notes from primary care and specialist visits, past discharge summaries as available in EHR, including Care Everywhere. Prior diagnostic testing as pertinent to current admission diagnoses Updated medications and problem lists for reconciliation ED course, including vitals, labs, imaging, treatment and response to treatment Triage notes, nursing and pharmacy notes and ED provider's notes Notable results as noted in HPI   Assessment and Plan: * CAP (community acquired pneumonia) Acute hypoxic respiratory failure Patient with shortness of breath and cough with increased work of breathing,  O2 requirement.  Respiratory viral panel negative CTA showing multifocal bilateral airspace disease favor pneumonia over metastatic disease Continue Levaquin Continue supplemental oxygen Antitussives, incentive spirometer DuoNebs as needed   AKI (acute kidney injury) ([place]) Metabolic acidosis Hyponatremia Suspect prerenal and related to poor oral intake from acute illness IV hydration with NS Monitor renal function and serum sodium  Atrial fibrillation, chronic (HCC) Continue Eliquis and metoprolol  Breast cancer metastasized to axillary lymph node, right ([place]) Patient is on oral chemotherapy Continue pending med rec  Essential hypertension Continue lisinopril and  metoprolol        DVT prophylaxis: Eliquis  Consults: none  Advance Care Planning: full code  Family Communication: none  Disposition Plan: Back to previous home environment  Severity of Illness: The appropriate patient status for this patient is INPATIENT. Inpatient status is judged to be reasonable and necessary in order to provide the required intensity of service to ensure the patient's safety. The patient's presenting symptoms, physical exam findings, and initial radiographic and laboratory data in the context of their chronic comorbidities is felt to place them at high risk for further clinical deterioration. Furthermore, it is not anticipated that the patient will be medically stable for discharge from the hospital within 2 midnights of admission.   * I certify that at the point of admission it is my clinical judgment that the patient will require inpatient hospital care spanning beyond 2 midnights from the point of admission due to high intensity of service, high risk for further deterioration and high frequency of surveillance required.*  Author: Athena Masse, MD 03/25/2022 11:48 PM  For on call review [email or web address].

## 2022-03-13 NOTE — Assessment & Plan Note (Signed)
Acute hypoxic respiratory failure Patient with shortness of breath and cough with increased work of breathing, O2 requirement.  Respiratory viral panel negative CTA showing multifocal bilateral airspace disease favor pneumonia over metastatic disease Continue Levaquin Continue supplemental oxygen Antitussives, incentive spirometer DuoNebs as needed

## 2022-03-14 ENCOUNTER — Encounter: Payer: Self-pay | Admitting: Internal Medicine

## 2022-03-14 LAB — CBC
HCT: 31.6 % — ABNORMAL LOW (ref 36.0–46.0)
Hemoglobin: 10.2 g/dL — ABNORMAL LOW (ref 12.0–15.0)
MCH: 30.4 pg (ref 26.0–34.0)
MCHC: 32.3 g/dL (ref 30.0–36.0)
MCV: 94 fL (ref 80.0–100.0)
Platelets: 268 10*3/uL (ref 150–400)
RBC: 3.36 MIL/uL — ABNORMAL LOW (ref 3.87–5.11)
RDW: 15.6 % — ABNORMAL HIGH (ref 11.5–15.5)
WBC: 7.5 10*3/uL (ref 4.0–10.5)
nRBC: 0 % (ref 0.0–0.2)

## 2022-03-14 LAB — STREP PNEUMONIAE URINARY ANTIGEN: Strep Pneumo Urinary Antigen: NEGATIVE

## 2022-03-14 LAB — PROCALCITONIN: Procalcitonin: 0.22 ng/mL

## 2022-03-14 LAB — BASIC METABOLIC PANEL
Anion gap: 11 (ref 5–15)
BUN: 14 mg/dL (ref 8–23)
CO2: 22 mmol/L (ref 22–32)
Calcium: 8.3 mg/dL — ABNORMAL LOW (ref 8.9–10.3)
Chloride: 102 mmol/L (ref 98–111)
Creatinine, Ser: 1.58 mg/dL — ABNORMAL HIGH (ref 0.44–1.00)
GFR, Estimated: 34 mL/min — ABNORMAL LOW (ref >60)
Glucose, Bld: 118 mg/dL — ABNORMAL HIGH (ref 70–99)
Potassium: 3.2 mmol/L — ABNORMAL LOW (ref 3.5–5.1)
Sodium: 135 mmol/L (ref 135–145)

## 2022-03-14 MED ORDER — LEVOFLOXACIN IN D5W 750 MG/150ML IV SOLN: 750.0000 mg | INTRAVENOUS | Status: DC

## 2022-03-14 NOTE — ED Notes (Signed)
Request made for transport to the floor ?

## 2022-03-14 NOTE — Progress Notes (Signed)
Pharmacy Antibiotic Note  Erica Waters is a 77 y.o. female admitted on 03/24/2022 with pneumonia.  Pharmacy has been consulted for levaquin dosing.  Could not verify pt had tolerated cephalosporins or PCN's previously; will continue with levaquin.   Plan: Levaquin 750 mg IV X 1 given in ED on 1/06 @ 0013. Levaquin 750 mg IV Q48H ordered to start on 1/06 @ 0000.   Height: '5\' 5"'$  (165.1 cm) Weight: 98.4 kg (217 lb) IBW/kg (Calculated) : 57  Temp (24hrs), Avg:98.2 F (36.8 C), Min:98 F (36.7 C), Max:98.3 F (36.8 C)  Recent Labs  Lab 03/21/2022 1424 04/01/2022 2239  WBC 8.2  --   CREATININE 1.46*  --   LATICACIDVEN  --  1.4    Estimated Creatinine Clearance: 38.7 mL/min (A) (by C-G formula based on SCr of 1.46 mg/dL (H)).    Allergies  Allergen Reactions   Penicillin G Hives and Shortness Of Breath    Antimicrobials this admission:   >>    >>   Dose adjustments this admission:   Microbiology results:  BCx:   UCx:    Sputum:    MRSA PCR:   Thank you for allowing pharmacy to be a part of this patient's care.  Amadea Keagy D 03/14/2022 1:06 AM

## 2022-03-14 NOTE — Progress Notes (Addendum)
Progress Note   Patient: Erica Waters:[id number] DOB: [birth year] DOA: 03/21/2022     1 DOS: the patient was seen and examined on 03/14/2022   Brief hospital course:  Erica Waters is a 77 y.o. 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 and a former smoker who presents to the ED from urgent care for evaluation of hypoxia.  She presented there with a 2-week history of a dry cough and shortness of breath and generalized malaise and was found to be hypoxic to the 60s.  She was placed on O2 at 3 L with improvement to the 90s and sent to the ED.  Patient denies chest pain, lower extremity pain or swelling and denies fever or chills and denies abdominal pain, diarrhea or dysuria.  She is compliant with her Eliquis. ED course and data review: afebrile with pulse in the 70s to 80s, respirations 22, BP 112/49 and O2 sat initially 87% on 3 L improving to high 90s on 4 L.  WBC 8200 with lactic acid 1.4.  Respiratory viral panel negative for COVID flu and RSV.  Troponin 21 and BNP 27.  BMP significant for creatinine of 1.46 above baseline of 0.98, with bicarb of 18 and sodium 128.  EKG, personally viewed and interpreted showing NSR at 96 with no acute ST-T wave changes. CTA chest negative for PE and showing multifocal pneumonia versus metastatic disease as described below: IMPRESSION: 1. No evidence of pulmonary embolus. 2. Multifocal bilateral airspace disease, favor pneumonia over metastatic disease. Atypical viral etiologies such as COVID-19 should be considered based on pattern of findings. 3. Nodular thickening left adrenal gland, concerning for metastatic disease in a patient with a history of breast and lung cancer. 4.  Aortic Atherosclerosis (ICD10-I70.0).   Patient started on Levaquin due to history of severe penicillin allergy and given an IV fluid bolus of  NS and hospitalist consulted for admission.   Assessment and Plan:  CAP (community acquired pneumonia) Acute hypoxic respiratory failure  Patient with shortness of breath and cough with increased work of breathing, O2 requirement.  Respiratory viral panel negative CTA showing multifocal bilateral airspace disease favor pneumonia over metastatic disease -patient symptomatically better on baseline 3 L oxygen which she is on secondary to her metastatic lung cancer. Continue Levaquin Continue supplemental oxygen Antitussives, incentive spirometer DuoNebs as needed  AKI (acute kidney injury)  Metabolic acidosis Hyponatremia Suspect prerenal and related to poor oral intake from acute illness IV hydration with NS Monitor renal function and serum sodium  Atrial fibrillation, chronic (HCC) Continue Eliquis and metoprolol  Breast cancer metastasized to axillary lymph node, right ([place]) Patient is on oral chemotherapy Continue pending med rec  Essential hypertension Continue lisinopril and metoprolol     Subjective: Patient seen and examined this morning.  Vital labs and imaging reviewed.  Patient symptomatically feeling better with no complaint of chest pain or shortness of breath.  Vitally stable labs within reference range.  Physical Exam: Vitals:   03/14/22 1330 03/14/22 1415 03/14/22 1420 03/14/22 1500  BP: (!) 106/51  (!) 124/55 (!) 139/56  Pulse: 74 74 77 80  Resp: (!) '23 20 17 19  '$ Temp:      TempSrc:      SpO2: 94% 97% 93% 98%  Weight:      Height:       Data Reviewed:  There are no new results to review  at this time.  Family Communication: None by bedside. Pt Aox3 understands her condition and conversant with family.  .  Disposition: Status is: Inpatient Remains inpatient appropriate because: Pneumonia in the setting of lung cancer and increased O2 requirement.   Planned Discharge Destination: Home    Time spent: 30 minutes  Author: Oran Rein,  MD 03/14/2022 4:32 PM  For on call review [email or web address].

## 2022-03-14 NOTE — ED Notes (Signed)
Pt repositioned by this RN and another, slid up in the bed for comfort. Afterward, pt got very anxious and began shaking and hyperventilating, then becoming hypoxic (82% on [facility]). Pt was then bumped up to [facility] and sats now >93%. Pt more calm and relaxed at this time, breathing unlabored and sats improved.

## 2022-03-15 ENCOUNTER — Inpatient Hospital Stay: Payer: Medicare Other

## 2022-03-15 LAB — CBC
HCT: 29.4 % — ABNORMAL LOW (ref 36.0–46.0)
Hemoglobin: 9.9 g/dL — ABNORMAL LOW (ref 12.0–15.0)
MCH: 30.8 pg (ref 26.0–34.0)
MCHC: 33.7 g/dL (ref 30.0–36.0)
MCV: 91.6 fL (ref 80.0–100.0)
Platelets: 282 10*3/uL (ref 150–400)
RBC: 3.21 MIL/uL — ABNORMAL LOW (ref 3.87–5.11)
RDW: 15.3 % (ref 11.5–15.5)
WBC: 7.9 10*3/uL (ref 4.0–10.5)
nRBC: 0 % (ref 0.0–0.2)

## 2022-03-15 LAB — BLOOD GAS, ARTERIAL
Acid-Base Excess: 1.3 mmol/L (ref 0.0–2.0)
Bicarbonate: 24.7 mmol/L (ref 20.0–28.0)
O2 Saturation: 87.1 %
Patient temperature: 37
pCO2 arterial: 34 mmHg (ref 32–48)
pH, Arterial: 7.47 — ABNORMAL HIGH (ref 7.35–7.45)
pO2, Arterial: 52 mmHg — ABNORMAL LOW (ref 83–108)

## 2022-03-15 LAB — COMPREHENSIVE METABOLIC PANEL
ALT: 40 U/L (ref 0–44)
AST: 34 U/L (ref 15–41)
Albumin: 2.3 g/dL — ABNORMAL LOW (ref 3.5–5.0)
Alkaline Phosphatase: 92 U/L (ref 38–126)
Anion gap: 7 (ref 5–15)
BUN: 8 mg/dL (ref 8–23)
CO2: 24 mmol/L (ref 22–32)
Calcium: 8.3 mg/dL — ABNORMAL LOW (ref 8.9–10.3)
Chloride: 105 mmol/L (ref 98–111)
Creatinine, Ser: 1 mg/dL (ref 0.44–1.00)
GFR, Estimated: 59 mL/min — ABNORMAL LOW (ref >60)
Glucose, Bld: 107 mg/dL — ABNORMAL HIGH (ref 70–99)
Potassium: 3.1 mmol/L — ABNORMAL LOW (ref 3.5–5.1)
Sodium: 136 mmol/L (ref 135–145)
Total Bilirubin: 0.7 mg/dL (ref 0.3–1.2)
Total Protein: 5.8 g/dL — ABNORMAL LOW (ref 6.5–8.1)

## 2022-03-15 MED ORDER — POTASSIUM CHLORIDE 20 MEQ PO PACK: 40.0000 meq | PACK | ORAL | Status: DC

## 2022-03-15 MED ORDER — POTASSIUM CHLORIDE 20 MEQ PO PACK
40.0000 meq | PACK | ORAL | Status: AC
  Administered 2022-03-15 (×3): 40 meq via ORAL
  Filled 2022-03-15 (×3): qty 2

## 2022-03-15 MED ORDER — LEVOFLOXACIN IN D5W 750 MG/150ML IV SOLN
750.0000 mg | INTRAVENOUS | Status: DC
  Administered 2022-03-15 – 2022-03-18 (×4): 750 mg via INTRAVENOUS
  Filled 2022-03-15 (×5): qty 150

## 2022-03-15 NOTE — Progress Notes (Signed)
Pharmacy Antibiotic Note  Erica Waters is a 77 y.o. female admitted on 03/12/2022 with pneumonia.  Pharmacy has been consulted for levaquin dosing.  Could not verify pt had tolerated cephalosporins or PCN's previously; will continue with levaquin.   Plan: Change Levaquin to 750 mg IV every 24 hours due to improvement in renal function.  Height: '5\' 5"'$  (165.1 cm) Weight: 98.4 kg (217 lb) IBW/kg (Calculated) : 57  Temp (24hrs), Avg:98.4 F (36.9 C), Min:98.2 F (36.8 C), Max:98.8 F (37.1 C)  Recent Labs  Lab 03/28/2022 1424 03/14/2022 2239 03/14/22 0328 03/15/22 0411  WBC 8.2  --  7.5 7.9  CREATININE 1.46*  --  1.58* 1.00  LATICACIDVEN  --  1.4  --   --      Estimated Creatinine Clearance: 56.5 mL/min (by C-G formula based on SCr of 1 mg/dL).    Allergies  Allergen Reactions   Penicillin G Hives and Shortness Of Breath    Antimicrobials this admission: Levaquin 1/6>>  Dose adjustments this admission: Levaquin 750 mg Q48h to Q24h  Microbiology results:  1/5 BCx: ngtd  Thank you for allowing pharmacy to be a part of this patient's care.  Lorin Picket, PharmD 03/15/2022 2:28 PM

## 2022-03-15 NOTE — Progress Notes (Addendum)
Progress Note   Patient: Erica Waters OYD:[id number] DOB: Aug 18, 1945 DOA: 04/07/2022     2 DOS: the patient was seen and examined on 03/15/2022   Brief hospital course:  Erica Waters is a 77 y.o. 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 and a former smoker who presents to the ED from urgent care for evaluation of hypoxia.  She presented there with a 2-week history of a dry cough and shortness of breath and generalized malaise and was found to be hypoxic to the 60s.  She was placed on O2 at 3 L with improvement to the 90s and sent to the ED.  Patient denies chest pain, lower extremity pain or swelling and denies fever or chills and denies abdominal pain, diarrhea or dysuria.  She is compliant with her Eliquis.  ED course and data review: afebrile with pulse in the 70s to 80s, respirations 22, BP 112/49 and O2 sat initially 87% on 3 L improving to high 90s on 4 L.  WBC 8200 with lactic acid 1.4.  Respiratory viral panel negative for COVID flu and RSV.  Troponin 21 and BNP 27.  BMP significant for creatinine of 1.46 above baseline of 0.98, with bicarb of 18 and sodium 128.  EKG, personally viewed and interpreted showing NSR at 96 with no acute ST-T wave changes. CTA chest negative for PE and showing multifocal pneumonia versus metastatic disease.  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  Patient was started on Levaquin due to history of severe penicillin allergy and given an IV fluid bolus of NS and hospitalist consulted for admission.   Assessment and Plan:  CAP (community acquired pneumonia) Acute hypoxic respiratory failure  Patient with shortness of breath and cough with increased work of breathing, O2 requirement.  Respiratory viral panel negative CTA showing multifocal bilateral airspace disease favor pneumonia over metastatic disease -patient  symptomatically better on 4.5 L from 6L  -Continue Levaquin day 2 today  -Continue supplemental oxygen -Antitussives, incentive spirometer -DuoNebs as needed  AKI (acute kidney injury)- resolved Metabolic acidosis -resolved Hyponatremia -resolved Suspect prerenal and related to poor oral intake from acute illness IV hydration with NS Monitor renal function and serum sodium  Atrial fibrillation, chronic (HCC) Continue Eliquis and metoprolol  Breast cancer metastasized to axillary lymph node, right ([place]) Patient is on oral chemotherapy Continue pending med rec  Essential hypertension Continue lisinopril and metoprolol  Subjective: Patient seen and examined this morning.  Vital labs and imaging reviewed.  Patient symptomatically feeling better with no complaint of chest pain or shortness of breath.  Vitally stable labs within reference range.  Patient weaned from 6 L nasal cannula to 4.5 L tolerating well.  Physical Exam: Vitals:   03/14/22 2345 03/15/22 0336 03/15/22 0722 03/15/22 1132  BP: (!) 121/50 (!) 134/53 112/61 (!) 104/53  Pulse: 83 85 87 90  Resp: '18 16 20 20  '$ Temp: 98.2 F (36.8 C) 98.3 F (36.8 C) 98.2 F (36.8 C) 98.8 F (37.1 C)  TempSrc: Oral Oral  Oral  SpO2: 95% 94% 93% 94%  Weight:      Height:       Physical Exam Constitutional:      Appearance: Normal appearance. She is obese.  HENT:     Head: Normocephalic and atraumatic.     Nose: Nose normal.  Eyes:     Pupils: Pupils are equal, round, and reactive to  light.  Cardiovascular:     Rate and Rhythm: Normal rate.  Pulmonary:     Effort: Respiratory distress present.  Abdominal:     General: Bowel sounds are normal.     Palpations: Abdomen is soft.  Musculoskeletal:        General: Normal range of motion.     Cervical back: Normal range of motion.  Skin:    General: Skin is warm.  Neurological:     General: No focal deficit present.     Mental Status: She is alert.  Psychiatric:         Mood and Affect: Mood normal.     Data Reviewed:  There are no new results to review at this time.  Family Communication: None by bedside. Pt Aox3 understands her condition and conversant with family.  .  Disposition: Status is: Inpatient Remains inpatient appropriate because: Pneumonia in the setting of lung cancer and increased O2 requirement.   Planned Discharge Destination: Home    Time spent: 30 minutes  Author: Oran Rein, MD 03/15/2022 3:33 PM  For on call review [email or web address].

## 2022-03-16 DIAGNOSIS — C773 Secondary and unspecified malignant neoplasm of axilla and upper limb lymph nodes: Secondary | ICD-10-CM | POA: Diagnosis not present

## 2022-03-16 DIAGNOSIS — J189 Pneumonia, unspecified organism: Secondary | ICD-10-CM | POA: Diagnosis not present

## 2022-03-16 DIAGNOSIS — E872 Acidosis, unspecified: Secondary | ICD-10-CM | POA: Diagnosis not present

## 2022-03-16 DIAGNOSIS — Z515 Encounter for palliative care: Secondary | ICD-10-CM

## 2022-03-16 DIAGNOSIS — C50911 Malignant neoplasm of unspecified site of right female breast: Secondary | ICD-10-CM

## 2022-03-16 DIAGNOSIS — J9601 Acute respiratory failure with hypoxia: Secondary | ICD-10-CM | POA: Diagnosis not present

## 2022-03-16 LAB — BLOOD GAS, VENOUS
Acid-Base Excess: 0.8 mmol/L (ref 0.0–2.0)
Bicarbonate: 24.2 mmol/L (ref 20.0–28.0)
O2 Saturation: 94.2 %
Patient temperature: 37
pCO2, Ven: 34 mmHg — ABNORMAL LOW (ref 44–60)
pH, Ven: 7.46 — ABNORMAL HIGH (ref 7.25–7.43)
pO2, Ven: 64 mmHg — ABNORMAL HIGH (ref 32–45)

## 2022-03-16 LAB — COMPREHENSIVE METABOLIC PANEL
ALT: 32 U/L (ref 0–44)
AST: 24 U/L (ref 15–41)
Albumin: 2.3 g/dL — ABNORMAL LOW (ref 3.5–5.0)
Alkaline Phosphatase: 121 U/L (ref 38–126)
Anion gap: 9 (ref 5–15)
BUN: 9 mg/dL (ref 8–23)
CO2: 24 mmol/L (ref 22–32)
Calcium: 8.8 mg/dL — ABNORMAL LOW (ref 8.9–10.3)
Chloride: 103 mmol/L (ref 98–111)
Creatinine, Ser: 0.9 mg/dL (ref 0.44–1.00)
GFR, Estimated: >60 mL/min (ref >60)
Glucose, Bld: 113 mg/dL — ABNORMAL HIGH (ref 70–99)
Potassium: 3.8 mmol/L (ref 3.5–5.1)
Sodium: 136 mmol/L (ref 135–145)
Total Bilirubin: 0.8 mg/dL (ref 0.3–1.2)
Total Protein: 6 g/dL — ABNORMAL LOW (ref 6.5–8.1)

## 2022-03-16 LAB — CBC
HCT: 32.3 % — ABNORMAL LOW (ref 36.0–46.0)
Hemoglobin: 10.5 g/dL — ABNORMAL LOW (ref 12.0–15.0)
MCH: 29.9 pg (ref 26.0–34.0)
MCHC: 32.5 g/dL (ref 30.0–36.0)
MCV: 92 fL (ref 80.0–100.0)
Platelets: 245 10*3/uL (ref 150–400)
RBC: 3.51 MIL/uL — ABNORMAL LOW (ref 3.87–5.11)
RDW: 15.5 % (ref 11.5–15.5)
WBC: 9.3 10*3/uL (ref 4.0–10.5)
nRBC: 0 % (ref 0.0–0.2)

## 2022-03-16 LAB — MRSA NEXT GEN BY PCR, NASAL: MRSA by PCR Next Gen: NOT DETECTED

## 2022-03-16 LAB — PROCALCITONIN: Procalcitonin: 0.43 ng/mL

## 2022-03-16 LAB — GLUCOSE, CAPILLARY: Glucose-Capillary: 142 mg/dL — ABNORMAL HIGH (ref 70–99)

## 2022-03-16 LAB — BRAIN NATRIURETIC PEPTIDE: B Natriuretic Peptide: 423.2 pg/mL — ABNORMAL HIGH (ref 0.0–100.0)

## 2022-03-16 MED ORDER — FUROSEMIDE 10 MG/ML IJ SOLN
40.0000 mg | Freq: Once | INTRAMUSCULAR | Status: AC
  Administered 2022-03-16: 40 mg via INTRAVENOUS
  Filled 2022-03-16: qty 4

## 2022-03-16 MED ORDER — METHYLPREDNISOLONE SODIUM SUCC 125 MG IJ SOLR
80.0000 mg | INTRAMUSCULAR | Status: AC
  Administered 2022-03-16 – 2022-03-17 (×2): 80 mg via INTRAVENOUS
  Filled 2022-03-16 (×2): qty 2

## 2022-03-16 MED ORDER — CHLORHEXIDINE GLUCONATE CLOTH 2 % EX PADS
6.0000 | MEDICATED_PAD | Freq: Every day | CUTANEOUS | Status: DC
  Administered 2022-03-16 – 2022-03-21 (×5): 6 via TOPICAL

## 2022-03-16 MED ORDER — MORPHINE SULFATE (PF) 2 MG/ML IV SOLN
2.0000 mg | Freq: Once | INTRAVENOUS | Status: AC
  Administered 2022-03-16: 2 mg via INTRAVENOUS
  Filled 2022-03-16: qty 1

## 2022-03-16 NOTE — Consult Note (Signed)
[place] [facility] Telephone:(336) [phone] Fax:(336) [phone]   Name: Erica Waters Date: 03/16/2022 MRN: [id number]  DOB: 06/07/1945  Patient Care Team: Derinda Late, MD as PCP - General (Family Medicine)    REASON FOR CONSULTATION: Erica Waters is a 77 y.o. female with multiple medical problems including PVD, A-fib, stage IV ER/PR positive breast cancer on treatment at [facility] with Verzenio.  Patient was admitted to hospital 03/19/2022 with community-acquired pneumonia.  Hospitalization has been complicated by worsening respiratory failure requiring BiPAP.  Palliative care was consulted to address goals.  SOCIAL HISTORY:     reports that she quit smoking about 4 years ago. Her smoking use included cigarettes. She has a 22.50 pack-year smoking history. She does not have any smokeless tobacco history on file. She reports that she does not use drugs.  Patient is married and lives at home with her husband and son.  Patient has another son in [place].  ADVANCE DIRECTIVES:  Does not have  CODE STATUS: Full code  PAST MEDICAL HISTORY: Past Medical History:  Diagnosis Date   Dysrhythmia    afib   Stroke [facility])     PAST SURGICAL HISTORY:  Past Surgical History:  Procedure Laterality Date   BREAST BIOPSY Right 08/12/2021   [place] bx 10:00 coil marker, [place] bx 12:00 ribbon marker, [place] bx axilla LN, hydro marker, path pending   COLONOSCOPY N/A 03/27/2021   Procedure: COLONOSCOPY;  Surgeon: Annamaria Helling, DO;  Location: [place];  Service: Gastroenterology;  Laterality: N/A;   ESOPHAGOGASTRODUODENOSCOPY N/A 03/27/2021   Procedure: ESOPHAGOGASTRODUODENOSCOPY (EGD);  Surgeon: Annamaria Helling, DO;  [facility];  Service: Gastroenterology;  Laterality: N/A;   HERNIA REPAIR     umbilical    HEMATOLOGY/ONCOLOGY HISTORY:  Oncology History   No history exists.    ALLERGIES:  is allergic to penicillin  g.  MEDICATIONS:  Current Facility-Administered Medications  Medication Dose Route Frequency Provider Last Rate Last Admin   acetaminophen (TYLENOL) tablet 650 mg  650 mg Oral Q6H PRN Athena Masse, MD   650 mg at 03/15/22 1625   Or   acetaminophen (TYLENOL) suppository 650 mg  650 mg Rectal Q6H PRN Athena Masse, MD   650 mg at 03/16/22 1323   albuterol (PROVENTIL) (2.5 MG/3ML) 0.083% nebulizer solution 2.5 mg  2.5 mg Nebulization Q4H PRN Athena Masse, MD   2.5 mg at 03/16/22 1111   apixaban (ELIQUIS) tablet 5 mg  5 mg Oral BID Athena Masse, MD   5 mg at 03/16/22 2423   Chlorhexidine Gluconate Cloth 2 % PADS 6 each  6 each Topical Daily Richarda Osmond, MD   6 each at 03/16/22 1331   guaiFENesin-dextromethorphan (ROBITUSSIN DM) 100-10 MG/5ML syrup 10 mL  10 mL Oral Q4H PRN Athena Masse, MD   10 mL at 03/16/22 1127   levofloxacin (LEVAQUIN) IVPB 750 mg  750 mg Intravenous Q24H Lorin Picket, RPH 100 mL/hr at 03/15/22 1639 750 mg at 03/15/22 1639   lisinopril (ZESTRIL) tablet 20 mg  20 mg Oral Daily Judd Gaudier V, MD   20 mg at 03/16/22 5361   methylPREDNISolone sodium succinate (SOLU-MEDROL) 125 mg/2 mL injection 80 mg  80 mg Intravenous Q24 Hr x 2 Darel Hong D, NP   80 mg at 03/16/22 1144   metoprolol succinate (TOPROL-XL) 24 hr tablet 12.5 mg  12.5 mg Oral Daily Athena Masse, MD  12.5 mg at 03/16/22 0952   ondansetron (ZOFRAN) tablet 4 mg  4 mg Oral Q6H PRN Athena Masse, MD       Or   ondansetron [facility]) injection 4 mg  4 mg Intravenous Q6H PRN Athena Masse, MD   4 mg at 03/14/22 1723   simvastatin (ZOCOR) tablet 10 mg  10 mg Oral q1800 Athena Masse, MD   10 mg at 03/15/22 1643    VITAL SIGNS: BP 98/62   Pulse 84   Temp (!) 101.7 F (38.7 C) (Axillary)   Resp (!) 27   Ht '5\' 5"'$  (1.651 m)   Wt 217 lb (98.4 kg)   SpO2 94%   BMI 36.11 kg/m  Filed Weights   03/14/22 0053  Weight: 217 lb (98.4 kg)    Estimated body mass index is 36.11 kg/m  as calculated from the following:   Height as of this encounter: '5\' 5"'$  (1.651 m).   Weight as of this encounter: 217 lb (98.4 kg).  LABS: CBC:    Component Value Date/Time   WBC 9.3 03/16/2022 0613   HGB 10.5 (L) 03/16/2022 0613   HCT 32.3 (L) 03/16/2022 0613   PLT 245 03/16/2022 0613   MCV 92.0 03/16/2022 0613   Comprehensive Metabolic Panel:    Component Value Date/Time   NA 136 03/16/2022 0613   K 3.8 03/16/2022 0613   CL 103 03/16/2022 0613   CO2 24 03/16/2022 0613   BUN 9 03/16/2022 0613   CREATININE 0.90 03/16/2022 0613   GLUCOSE 113 (H) 03/16/2022 0613   CALCIUM 8.8 (L) 03/16/2022 0613   AST 24 03/16/2022 0613   ALT 32 03/16/2022 0613   ALKPHOS 121 03/16/2022 0613   BILITOT 0.8 03/16/2022 0613   PROT 6.0 (L) 03/16/2022 0613   ALBUMIN 2.3 (L) 03/16/2022 0613    RADIOGRAPHIC STUDIES: DG Chest Port 1 View  Result Date: 03/15/2022 CLINICAL DATA:  Progressive shortness of breath. EXAM: PORTABLE CHEST 1 VIEW COMPARISON:  03/09/2022 FINDINGS: Heart size and mediastinal contours are unremarkable. Aortic atherosclerotic calcifications. Lung volumes are low. Progressive, bilateral upper and lower lobe interstitial and airspace opacities. IMPRESSION: Progressive, bilateral upper and lower lobe interstitial and airspace opacities compatible with multifocal pneumonia. Electronically Signed   By: Kerby Moors M.D.   On: 03/15/2022 17:32   CT Angio Chest PE W and/or Wo Contrast  Result Date: 03/19/2022 CLINICAL DATA:  Cough for 2 weeks, hypoxia, history of stage IV lung and breast cancer EXAM: CT ANGIOGRAPHY CHEST WITH CONTRAST TECHNIQUE: Multidetector CT imaging of the chest was performed using the standard protocol during bolus administration of intravenous contrast. Multiplanar CT image reconstructions and MIPs were obtained to evaluate the vascular anatomy. RADIATION DOSE REDUCTION: This exam was performed according to the departmental dose-optimization program which includes  automated exposure control, adjustment of the mA and/or kV according to patient size and/or use of iterative reconstruction technique. CONTRAST:  5m OMNIPAQUE IOHEXOL 350 MG/ML SOLN COMPARISON:  04/04/2022 FINDINGS: Cardiovascular: This is a technically adequate evaluation of the pulmonary vasculature. No filling defects or pulmonary emboli. The heart is unremarkable without pericardial effusion. No evidence of thoracic aortic aneurysm or dissection. Atherosclerosis of the aorta and coronary vasculature. Mediastinum/Nodes: No enlarged mediastinal, hilar, or axillary lymph nodes. Thyroid gland, trachea, and esophagus demonstrate no significant findings. Small hiatal hernia. Lungs/Pleura: There is diffuse increased interstitial prominence, with multifocal bilateral airspace disease as seen on preceding chest x-ray. I would favor multifocal bilateral pneumonia such as COVID-19  over metastatic disease. No effusion or pneumothorax. Central airways are patent. Upper Abdomen: Nodular thickening of the left adrenal gland measuring up to 18 mm, suspicious for metastatic disease in a patient with a history of lung and breast cancer. No other acute upper abdominal findings. Musculoskeletal: No acute or destructive bony lesions. Reconstructed images demonstrate no additional findings. Review of the MIP images confirms the above findings. IMPRESSION: 1. No evidence of pulmonary embolus. 2. Multifocal bilateral airspace disease, favor pneumonia over metastatic disease. Atypical viral etiologies such as COVID-19 should be considered based on pattern of findings. 3. Nodular thickening left adrenal gland, concerning for metastatic disease in a patient with a history of breast and lung cancer. 4.  Aortic Atherosclerosis (ICD10-I70.0). Electronically Signed   By: Randa Ngo M.D.   On: 04/01/2022 23:24   DG Chest 2 View  Result Date: 03/12/2022 CLINICAL DATA:  Cough. Decreased oxygen saturation. History of stage IV lung and  breast cancer. EXAM: CHEST - 2 VIEW COMPARISON:  None Available. FINDINGS: Heart size and mediastinal contours are unremarkable. Aortic atherosclerotic calcifications. Bilateral upper and lower lobe airspace opacities with diffuse increase interstitial markings identified. No signs of pleural effusion. Visualized osseous structures appear intact. IMPRESSION: Bilateral upper and lower lobe airspace opacities with diffuse increase interstitial markings. In the acute setting, imaging findings are concerning for multifocal pneumonia with superimposed interstitial edema. However, in the setting of stage IV lung cancer and breast cancer findings are less specific and may also be seen with diffuse pulmonary metastasis. Electronically Signed   By: Kerby Moors M.D.   On: 03/11/2022 14:55    PERFORMANCE STATUS (ECOG) : 1 - Symptomatic but completely ambulatory  Review of Systems Unless otherwise noted, a complete review of systems is negative.  Physical Exam General: Ill-appearing Pulmonary: On BiPAP Extremities: no edema, no joint deformities Skin: no rashes Neurological: Weakness but otherwise nonfocal  IMPRESSION: Patient was transferred to ICU earlier today with worsening hypoxic respiratory failure.  She is currently on BiPAP.  Patient was talking with her son about possible utilization of the ventilator when I entered the room.  However, conversation was limited somewhat as she is on BiPAP currently.  We discussed this together and patient and son both verbalized a desire for her to remain a full code/full scope of treatment.  I spoke with her son privately.  He says that he recognizes that patient has a stage IV cancer, which will ultimately prove terminal.  However, family was informed recently that cancer seem to be responding positively to oral chemotherapy.  Family are hopeful the patient will recover and be able to continue receiving cancer treatment.  However, in the event of significant  decline, son says that family would be willing to reconsider goals.  They would not want her sustained long-term on a ventilator.  Patient does not have any ACP documents nor has family previously discussed her end-of-life wishes.  Would recommend further conversations when patient is better able to participate.  PLAN: -Continue current scope of treatment -Full code  Case and plan discussed with Dr. Grayland Ormond  Time Total: 60 minutes  Visit consisted of counseling and education dealing with the complex and emotionally intense issues of symptom management and palliative care in the setting of serious and potentially life-threatening illness.Greater than 50%  of this time was spent counseling and coordinating care related to the above assessment and plan.  Signed by: Altha Harm, PhD, NP-C

## 2022-03-16 NOTE — Plan of Care (Signed)
  Problem: Activity: Goal: Ability to tolerate increased activity will improve Outcome: Not Progressing   Problem: Clinical Measurements: Goal: Ability to maintain a body temperature in the normal range will improve Outcome: Not Progressing   Problem: Respiratory: Goal: Ability to maintain adequate ventilation will improve Outcome: Not Progressing

## 2022-03-16 NOTE — Consult Note (Signed)
[place]  Telephone:(336) [phone] Fax:(336) [phone]  ID: Erica Waters OB: Jul 20, 1945  MR#: [id number]  WVP#:[id number]  Patient Care Team: Derinda Late, MD as PCP - General (Family Medicine)  CHIEF COMPLAINT: Stage IV ER/PR breast cancer, acute respiratory failure.  INTERVAL HISTORY: Patient is a 77 year old female recently diagnosed with stage IV ER/PR positive breast cancer who is being treated at [facility] with oral chemotherapy, Verzenio.  She reported increasing cough over the past several weeks and upon evaluation in the emergency room had a significantly reduced oxygen saturation.  Patient remains significantly shortness of breath despite being on BiPAP.  She has no neurologic complaints.  She denies any fevers.  She has a fair appetite.  She has no chest pain or hemoptysis.  She denies any nausea, vomiting, constipation, or diarrhea.  She has no urinary complaints.  Patient feels generally terrible, but otherwise no further specific complaints.  REVIEW OF SYSTEMS:   Review of Systems  Constitutional:  Positive for malaise/fatigue. Negative for fever and weight loss.  Respiratory:  Positive for cough and shortness of breath. Negative for hemoptysis.   Cardiovascular: Negative.  Negative for chest pain and leg swelling.  Gastrointestinal: Negative.  Negative for abdominal pain.  Genitourinary: Negative.  Negative for dysuria.  Musculoskeletal: Negative.  Negative for back pain.  Skin: Negative.  Negative for rash.  Neurological: Negative.  Negative for dizziness, focal weakness, weakness and headaches.  Psychiatric/Behavioral: Negative.  The patient is not nervous/anxious.     As per HPI. Otherwise, a complete review of systems is negative.  PAST MEDICAL HISTORY: Past Medical History:  Diagnosis Date   Dysrhythmia    afib   Stroke [facility])     PAST SURGICAL HISTORY: Past Surgical History:  Procedure Laterality Date   BREAST BIOPSY Right  08/12/2021   [place] bx 10:00 coil marker, [place] bx 12:00 ribbon marker, [place] bx axilla LN, hydro marker, path pending   COLONOSCOPY N/A 03/27/2021   Procedure: COLONOSCOPY;  Surgeon: Annamaria Helling, DO;  Location: [place];  Service: Gastroenterology;  Laterality: N/A;   ESOPHAGOGASTRODUODENOSCOPY N/A 03/27/2021   Procedure: ESOPHAGOGASTRODUODENOSCOPY (EGD);  Surgeon: Annamaria Helling, DO;  Location: [facility] ENDOSCOPY;  Service: Gastroenterology;  Laterality: N/A;   HERNIA REPAIR     umbilical    FAMILY HISTORY: Family History  Problem Relation Age of Onset   Breast cancer Neg Hx     ADVANCED DIRECTIVES (Y/N):  '@ADVDIR'$ @  HEALTH MAINTENANCE: Social History   Tobacco Use   Smoking status: Former    Packs/day: 0.50    Years: 45.00    Total pack years: 22.50    Types: Cigarettes    Quit date: 02/27/2018    Years since quitting: 4.0  Vaping Use   Vaping Use: Never used  Substance Use Topics   Drug use: Never     Colonoscopy:  PAP:  Bone density:  Lipid panel:  Allergies  Allergen Reactions   Penicillin G Hives and Shortness Of Breath    Current Facility-Administered Medications  Medication Dose Route Frequency Provider Last Rate Last Admin   acetaminophen (TYLENOL) tablet 650 mg  650 mg Oral Q6H PRN Athena Masse, MD   650 mg at 03/15/22 1625   Or   acetaminophen (TYLENOL) suppository 650 mg  650 mg Rectal Q6H PRN Athena Masse, MD   650 mg at 03/16/22 1323   albuterol (PROVENTIL) (2.5 MG/3ML) 0.083% nebulizer solution 2.5 mg  2.5 mg Nebulization Q4H PRN Judd Gaudier  V, MD   2.5 mg at 03/16/22 1111   apixaban (ELIQUIS) tablet 5 mg  5 mg Oral BID Athena Masse, MD   5 mg at 03/16/22 1308   Chlorhexidine Gluconate Cloth 2 % PADS 6 each  6 each Topical Daily Richarda Osmond, MD   6 each at 03/16/22 1331   guaiFENesin-dextromethorphan (ROBITUSSIN DM) 100-10 MG/5ML syrup 10 mL  10 mL Oral Q4H PRN Athena Masse, MD   10 mL at 03/16/22 1127   levofloxacin  (LEVAQUIN) IVPB 750 mg  750 mg Intravenous Q24H Lorin Picket, RPH 100 mL/hr at 03/15/22 1639 750 mg at 03/15/22 1639   lisinopril (ZESTRIL) tablet 20 mg  20 mg Oral Daily Judd Gaudier V, MD   20 mg at 03/16/22 6578   methylPREDNISolone sodium succinate (SOLU-MEDROL) 125 mg/2 mL injection 80 mg  80 mg Intravenous Q24 Hr x 2 Darel Hong D, NP   80 mg at 03/16/22 1144   metoprolol succinate (TOPROL-XL) 24 hr tablet 12.5 mg  12.5 mg Oral Daily Judd Gaudier V, MD   12.5 mg at 03/16/22 0952   ondansetron (ZOFRAN) tablet 4 mg  4 mg Oral Q6H PRN Athena Masse, MD       Or   ondansetron [facility]) injection 4 mg  4 mg Intravenous Q6H PRN Athena Masse, MD   4 mg at 03/14/22 1723   simvastatin (ZOCOR) tablet 10 mg  10 mg Oral q1800 Athena Masse, MD   10 mg at 03/15/22 1643    OBJECTIVE: Vitals:   03/16/22 1500 03/16/22 1600  BP: 107/61 98/62  Pulse: 94 84  Resp: (!) 30 (!) 27  Temp:    SpO2: 94% 94%     Body mass index is 36.11 kg/m.    ECOG FS:4 - Bedbound  General: Ill-appearing, significant respiratory distress. Eyes: Pink conjunctiva, anicteric sclera. HEENT: Normocephalic, moist mucous membranes. Lungs: Diminished breath sounds. Heart: Regular rate and rhythm. Abdomen: Soft, nontender, no obvious distention. Musculoskeletal: No edema, cyanosis, or clubbing. Neuro: Alert, answering all questions appropriately. Cranial nerves grossly intact. Skin: No rashes or petechiae noted. Psych: Normal affect.  LAB RESULTS:  Lab Results  Component Value Date   NA 136 03/16/2022   K 3.8 03/16/2022   CL 103 03/16/2022   CO2 24 03/16/2022   GLUCOSE 113 (H) 03/16/2022   BUN 9 03/16/2022   CREATININE 0.90 03/16/2022   CALCIUM 8.8 (L) 03/16/2022   PROT 6.0 (L) 03/16/2022   ALBUMIN 2.3 (L) 03/16/2022   AST 24 03/16/2022   ALT 32 03/16/2022   ALKPHOS 121 03/16/2022   BILITOT 0.8 03/16/2022   GFRNONAA >60 03/16/2022    Lab Results  Component Value Date   WBC 9.3  03/16/2022   HGB 10.5 (L) 03/16/2022   HCT 32.3 (L) 03/16/2022   MCV 92.0 03/16/2022   PLT 245 03/16/2022     STUDIES: DG Chest Port 1 View  Result Date: 03/15/2022 CLINICAL DATA:  Progressive shortness of breath. EXAM: PORTABLE CHEST 1 VIEW COMPARISON:  03/24/2022 FINDINGS: Heart size and mediastinal contours are unremarkable. Aortic atherosclerotic calcifications. Lung volumes are low. Progressive, bilateral upper and lower lobe interstitial and airspace opacities. IMPRESSION: Progressive, bilateral upper and lower lobe interstitial and airspace opacities compatible with multifocal pneumonia. Electronically Signed   By: Kerby Moors M.D.   On: 03/15/2022 17:32   CT Angio Chest PE W and/or Wo Contrast  Result Date: 03/12/2022 CLINICAL DATA:  Cough for 2 weeks, hypoxia,  history of stage IV lung and breast cancer EXAM: CT ANGIOGRAPHY CHEST WITH CONTRAST TECHNIQUE: Multidetector CT imaging of the chest was performed using the standard protocol during bolus administration of intravenous contrast. Multiplanar CT image reconstructions and MIPs were obtained to evaluate the vascular anatomy. RADIATION DOSE REDUCTION: This exam was performed according to the departmental dose-optimization program which includes automated exposure control, adjustment of the mA and/or kV according to patient size and/or use of iterative reconstruction technique. CONTRAST:  4m OMNIPAQUE IOHEXOL 350 MG/ML SOLN COMPARISON:  03/19/2022 FINDINGS: Cardiovascular: This is a technically adequate evaluation of the pulmonary vasculature. No filling defects or pulmonary emboli. The heart is unremarkable without pericardial effusion. No evidence of thoracic aortic aneurysm or dissection. Atherosclerosis of the aorta and coronary vasculature. Mediastinum/Nodes: No enlarged mediastinal, hilar, or axillary lymph nodes. Thyroid gland, trachea, and esophagus demonstrate no significant findings. Small hiatal hernia. Lungs/Pleura: There is  diffuse increased interstitial prominence, with multifocal bilateral airspace disease as seen on preceding chest x-ray. I would favor multifocal bilateral pneumonia such as COVID-19 over metastatic disease. No effusion or pneumothorax. Central airways are patent. Upper Abdomen: Nodular thickening of the left adrenal gland measuring up to 18 mm, suspicious for metastatic disease in a patient with a history of lung and breast cancer. No other acute upper abdominal findings. Musculoskeletal: No acute or destructive bony lesions. Reconstructed images demonstrate no additional findings. Review of the MIP images confirms the above findings. IMPRESSION: 1. No evidence of pulmonary embolus. 2. Multifocal bilateral airspace disease, favor pneumonia over metastatic disease. Atypical viral etiologies such as COVID-19 should be considered based on pattern of findings. 3. Nodular thickening left adrenal gland, concerning for metastatic disease in a patient with a history of breast and lung cancer. 4.  Aortic Atherosclerosis (ICD10-I70.0). Electronically Signed   By: MRanda NgoM.D.   On: 03/27/2022 23:24   DG Chest 2 View  Result Date: 04/07/2022 CLINICAL DATA:  Cough. Decreased oxygen saturation. History of stage IV lung and breast cancer. EXAM: CHEST - 2 VIEW COMPARISON:  None Available. FINDINGS: Heart size and mediastinal contours are unremarkable. Aortic atherosclerotic calcifications. Bilateral upper and lower lobe airspace opacities with diffuse increase interstitial markings identified. No signs of pleural effusion. Visualized osseous structures appear intact. IMPRESSION: Bilateral upper and lower lobe airspace opacities with diffuse increase interstitial markings. In the acute setting, imaging findings are concerning for multifocal pneumonia with superimposed interstitial edema. However, in the setting of stage IV lung cancer and breast cancer findings are less specific and may also be seen with diffuse pulmonary  metastasis. Electronically Signed   By: TKerby MoorsM.D.   On: 04/06/2022 14:55    ASSESSMENT: Stage IV ER/PR breast cancer, acute respiratory failure.  PLAN:    Stage IV ER/PR breast cancer: Patient initially diagnosed in June 2023.  CT scan on September 24, 2021 concerning for metastatic disease with pulmonary nodules and hilar lymphadenopathy.  Bronchoscopy one week later confirmed metastatic disease.  It appears patient finally initiated treatment with letrozole and Verenzio on October 17, 2021.  Patient has been tolerating her treatments well only with some mild diarrhea.  [facility] chronic back pain unrelated to her malignancy.  Patient has not had any restaging CT scans until recently.  Would be highly unusual to acutely present with lymphangitic spread of disease.  Also interstitial disease and pneumonitis associated with VIsac Sarnais highly unusual and incidence is likely less than  1%.  Patient has been instructed to hold her treatment until she is evaluated at [facility] upon discharge. Respiratory failure: Unclear etiology.  COVID and RSV are negative.  CT suggests infectious origin as do the acute onset of symptoms.  Although possible, will be highly unusual to have acute onset lymphangitic spread of an ER/PR positive breast cancer and incidence of pneumonitis with Isac Sarna is extremely small.  Patient at high risk for intubation.  Agree with antibiotics and steroids at this time.  Appreciate palliative care input. Anemia: Mild, monitor.  Appreciate consult, will follow.   Lloyd Huger, MD   03/16/2022 4:36 PM

## 2022-03-16 NOTE — Consult Note (Signed)
NAME:  Erica Waters, MRN:  [id number], DOB:  12/05/1945, LOS: 3 ADMISSION DATE:  04/02/2022   BRIEF SYNOPSIS RESP DISTRESS  History of Present Illness:  77 y.o. 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 and a former smoker who presents to the ED from urgent care for evaluation of hypoxia/SOB.     She presented there with a 2-week history of a dry cough and shortness of breath and generalized malaise and was found to be hypoxic to the 60s.  She was placed on O2 at 3 L with improvement to the 90s and sent to the ED.    ED course and data review: afebrile with pulse in the 70s to 80s, respirations 22, BP 112/49 and O2 sat initially 87% on 3 L improving to high 90s on 4 L.   wBC 8200 with lactic acid 1.4.   Respiratory viral panel negative for COVID flu and RSV.   Troponin 21 and BNP 27.   BMP significant for creatinine of 1.46 above baseline of 0.98, with bicarb of 18 and sodium 128.    CTA chest negative for PE and showing multifocal pneumonia versus metastatic disease.  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   Patient was started on Levaquin due to history of severe penicillin allergy and given an IV fluid bolus of NS   1/8 PCCM CONSULTED FOR SEVERE HYPOXIA AND WOB PLACED ON BIPAP  ONCOLOGY  Medical History   Oncology History Overview Note Denovo metastatic disease ER 99% PR 99% HER1 1+ FISH neg, to mediastinal LAD, stage IV Diagnosis: Screening mammo - abnormal R breast area 06/23/21 07/17/21 - diagnostic mammo - 3.1cm mass in R upper outer, abnormal R axillary LN 08/12/21 - Bx - c/w IDC, grade 2, LN bx + 09/24/21 - CT CAP concerning for hilar LAD and 2 pulmonary nodules c/f metastatic disease, bone scan negative 10/01/21 - bronchoscopy bx of mediastinal LN - metastatic adenocarcinoma c/w breast 10/17/21 - estradiol PET - ER+ disease in R breast  tissue, pulmonary and bone lesions, R axillary, mediastinal and bilateral hilar adenopathy 11/06/21 - started letrozole, plan for abemaciclib start '50mg'$  BID  Significant Hospital Events: Including procedures, antibiotic start and stop dates in addition to other pertinent events   ADMITTED 1/5 FOR HYPOXIA AND SOB 1/8 PCCM CONSULTED FOR SEVERE HYPOXIA      Micro Data:  COVID/FLU/RSV NEG   Antimicrobials:   Antibiotics Given (last 72 hours)     Date/Time Action Medication Dose Rate   03/14/22 0013 New Bag/Given   levofloxacin (LEVAQUIN) IVPB 750 mg 750 mg 100 mL/hr   03/15/22 1639 New Bag/Given   levofloxacin (LEVAQUIN) IVPB 750 mg 750 mg 100 mL/hr           Objective   Blood pressure (!) 113/51, pulse (!) 115, temperature 99.6 F (37.6 C), resp. rate (!) 42, height '5\' 5"'$  (1.651 m), weight 98.4 kg, SpO2 90 %.    FiO2 (%):  [60 %] 60 %   Intake/Output Summary (Last 24 hours) at 03/16/2022 1159 Last data filed at 03/16/2022 0900 Gross per 24 hour  Intake 240 ml  Output 600 ml  Net -360 ml   Filed Weights   03/14/22 0053  Weight: 98.4 kg     REVIEW OF SYSTEMS  PATIENT IS UNABLE TO PROVIDE COMPLETE REVIEW OF SYSTEMS DUE TO SEVERE CRITICAL ILLNESS/RESP FAILURE  PHYSICAL EXAMINATION:  GENERAL:critically ill  appearing, +resp distress EYES: Pupils equal, round, reactive to light.  No scleral icterus.  MOUTH: Moist mucosal membrane.  NECK: Supple.  PULMONARY: Lungs clear to auscultation, +rhonchi, +wheezing CARDIOVASCULAR: S1 and S2.  Regular rate and rhythm GASTROINTESTINAL: Soft, nontender, -distended. Positive bowel sounds.  MUSCULOSKELETAL: No swelling, clubbing, or edema.  NEUROLOGIC: alert and awake SKIN:normal, warm to touch, Capillary refill delayed  Pulses present bilaterally   Labs/imaging that I havepersonally reviewed  (right click and "Reselect all SmartList Selections" daily)       ASSESSMENT AND PLAN SYNOPSIS  76 YO WHITE FEMALE WITH  METASTATIC BREAST CANCER ON IMMUNOTHERAPY WITH PROGRESSIVE HYPOXIA AND RESP FAILURE, ETIOLOGIES INCLUDE DRUG INDUCED TOXICITY FROM IMMUNOTHERAPY, METASTATIC BEAST CANCER, INFECTIOUS CAUSES(ATYPICAL, PCP)    Severe ACUTE Hypoxic and Hypercapnic Respiratory Failure PLACED ON BIPAP HIGH RISK FOR INTUBATION,CARDIAC ARREST CONSIDER LASIX THERAPY START IV STEROIDS NEBS  FiO2 (%):  [60 %] 60 %   CARDIAC ICU monitoring   RENAL -continue Foley Catheter-assess need -Avoid nephrotoxic agents -Follow urine output, BMP -Ensure adequate renal perfusion, optimize oxygenation -Renal dose medications   Intake/Output Summary (Last 24 hours) at 03/16/2022 1159 Last data filed at 03/16/2022 0900 Gross per 24 hour  Intake 240 ml  Output 600 ml  Net -360 ml    INFECTIOUS DISEASE -continue antibiotics as prescribed -follow up cultures  ENDO - ICU hypoglycemic\Hyperglycemia protocol -check FSBS per protocol   GI GI PROPHYLAXIS as indicated  NUTRITIONAL STATUS DIET-->NPO Constipation protocol as indicated   ELECTROLYTES -follow labs as needed -replace as needed -pharmacy consultation and following  METASTATIC BREAST CANCER CONSULT ONCOLOGY    Best practice (right click and "Reselect all SmartList Selections" daily)  Diet: NPO Code Status:  FULL Disposition:ICU  Labs   CBC: Recent Labs  Lab 03/12/2022 1424 03/14/22 0328 03/15/22 0411 03/16/22 0613  WBC 8.2 7.5 7.9 9.3  HGB 11.1* 10.2* 9.9* 10.5*  HCT 34.4* 31.6* 29.4* 32.3*  MCV 95.0 94.0 91.6 92.0  PLT 322 268 282 562    Basic Metabolic Panel: Recent Labs  Lab 04/06/2022 1424 03/14/22 0328 03/15/22 0411 03/16/22 0613  NA 128* 135 136 136  K 3.5 3.2* 3.1* 3.8  CL 98 102 105 103  CO2 18* '22 24 24  '$ GLUCOSE 133* 118* 107* 113*  BUN '12 14 8 9  '$ CREATININE 1.46* 1.58* 1.00 0.90  CALCIUM 8.6* 8.3* 8.3* 8.8*   GFR: Estimated Creatinine Clearance: 62.8 mL/min (by C-G formula based on SCr of 0.9 mg/dL). Recent  Labs  Lab 03/10/2022 1424 03/25/2022 2239 03/14/22 0328 03/15/22 0411 03/16/22 0613  PROCALCITON 0.22  --   --   --   --   WBC 8.2  --  7.5 7.9 9.3  LATICACIDVEN  --  1.4  --   --   --     Liver Function Tests: Recent Labs  Lab 03/15/22 0411 03/16/22 0613  AST 34 24  ALT 40 32  ALKPHOS 92 121  BILITOT 0.7 0.8  PROT 5.8* 6.0*  ALBUMIN 2.3* 2.3*   No results for input(s): "LIPASE", "AMYLASE" in the last 168 hours. No results for input(s): "AMMONIA" in the last 168 hours.  ABG    Component Value Date/Time   PHART 7.47 (H) 03/15/2022 1635   PCO2ART 34 03/15/2022 1635   PO2ART 52 (L) 03/15/2022 1635   HCO3 24.7 03/15/2022 1635   O2SAT 87.1 03/15/2022 1635     Coagulation Profile: No results for input(s): "INR", "PROTIME" in the last 168 hours.  Cardiac Enzymes: No results for input(s): "CKTOTAL", "CKMB", "CKMBINDEX", "TROPONINI" in the last 168 hours.  HbA1C: No results found for: "HGBA1C"  CBG: No results for input(s): "GLUCAP" in the last 168 hours.   Past Medical History:  She,  has a past medical history of Dysrhythmia and Stroke ([facility]).   Surgical History:   Past Surgical History:  Procedure Laterality Date   BREAST BIOPSY Right 08/12/2021   [place] bx 10:00 coil marker, [place] bx 12:00 ribbon marker, [place] bx axilla LN, hydro marker, path pending   COLONOSCOPY N/A 03/27/2021   Procedure: COLONOSCOPY;  Surgeon: Annamaria Helling, DO;  Location: [facility] ENDOSCOPY;  Service: Gastroenterology;  Laterality: N/A;   ESOPHAGOGASTRODUODENOSCOPY N/A 03/27/2021   Procedure: ESOPHAGOGASTRODUODENOSCOPY (EGD);  Surgeon: Annamaria Helling, DO;  Location: [facility] ENDOSCOPY;  Service: Gastroenterology;  Laterality: N/A;   HERNIA REPAIR     umbilical     Social History:   reports that she quit smoking about 4 years ago. Her smoking use included cigarettes. She has a 22.50 pack-year smoking history. She does not have any smokeless tobacco history on file. She reports that she does  not use drugs.   Family History:  Her family history is negative for Breast cancer.   Allergies Allergies  Allergen Reactions   Penicillin G Hives and Shortness Of Breath     Home Medications  Prior to Admission medications   Medication Sig Start Date End Date Taking? Authorizing Provider  abemaciclib (VERZENIO) 100 MG tablet Take 100 mg by mouth 2 (two) times daily. 01/15/22  Yes [provider]  apixaban (ELIQUIS) 5 MG TABS tablet Take 5 mg by mouth 2 (two) times daily. 01/14/18  Yes [provider]  Cholecalciferol (VITAMIN D-1000 MAX ST) 25 MCG (1000 UT) tablet Take 1,000 Units by mouth daily.   Yes [provider]  cilostazol (PLETAL) 50 MG tablet Take 50 mg by mouth daily. 05/29/17  Yes [provider]  cyanocobalamin (VITAMIN B12) 1000 MCG tablet Take 1,000 mcg by mouth daily.   Yes [provider]  ezetimibe (ZETIA) 10 MG tablet Take 10 mg by mouth daily. 11/03/17  Yes [provider]  fluticasone (FLONASE) 50 MCG/ACT nasal spray Place 2 sprays into both nostrils daily.   Yes [provider]  letrozole (FEMARA) 2.5 MG tablet Take 2.5 mg by mouth daily.   Yes [provider]  lisinopril (ZESTRIL) 20 MG tablet Take 20 mg by mouth daily. 01/25/18  Yes [provider]  metoprolol succinate (TOPROL-XL) 25 MG 24 hr tablet Take 12.5 mg by mouth daily.   Yes [provider]  rosuvastatin (CRESTOR) 40 MG tablet Take 40 mg by mouth daily. 12/11/21 12/11/22 Yes [provider]  BLACK CURRANT SEED OIL PO Take by mouth. Patient not taking: Reported on 03/27/2021    [provider]  famotidine (PEPCID) 20 MG tablet Take 20 mg by mouth 2 (two) times daily. Patient not taking: Reported on 03/14/2022    [provider]  pantoprazole (PROTONIX) 40 MG tablet Take 40 mg by mouth at bedtime.    [provider]  PARoxetine (PAXIL) 20 MG tablet Take 20 mg by mouth daily. Patient not  taking: Reported on 03/14/2022 03/04/18 03/14/22  [provider]  PARoxetine (PAXIL) 30 MG tablet Take 30 mg by mouth at bedtime.    [provider]  simvastatin (ZOCOR) 10 MG tablet Take by mouth. Patient not taking: Reported on 03/14/2022 12/27/17   [provider]  vitamin  B-12 (CYANOCOBALAMIN) 1000 MCG tablet Take by mouth. Patient not taking: Reported on 03/27/2021    [provider]       DVT/GI PRX  assessed I Assessed the need for Labs I Assessed the need for Foley I Assessed the need for Central Venous Line Family Discussion when available I Assessed the need for Mobilization I made an Assessment of medications to be adjusted accordingly Safety Risk assessment completed  CASE DISCUSSED IN MULTIDISCIPLINARY ROUNDS WITH ICU TEAM     Critical Care Time devoted to patient care services described in this note is 75 minutes.   Critical care was necessary to treat /prevent imminent and life-threatening deterioration.   I ANTICIPATE PROLONGED ICU LOS  Patient is critically ill. Patient with Multiorgan failure and at high risk for cardiac arrest and death.    Corrin Parker, M.D.  [facility]  Medical Director [place] Director [facility] Cardio-Pulmonary Department

## 2022-03-16 NOTE — Care Management Important Message (Signed)
Important Message  Patient Details  Name: Erica Waters MRN: [id number] Date of Birth: [birth year]   Medicare Important Message Given:  Yes     Dannette Barbara 03/16/2022, 11:51 AM

## 2022-03-16 NOTE — Progress Notes (Signed)
Transfer to ICU 19

## 2022-03-16 NOTE — Progress Notes (Signed)
Progress Note   Patient: Erica Waters Erica Waters DOB: 28-May-1945 DOA: 03/27/2022     3 DOS: the patient was seen and examined on 03/16/2022   Brief hospital course: Erica Waters is a 77 y.o. 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 and a former smoker who presents to the ED from urgent care for evaluation of hypoxia.  She presented there with a 2-week history of a dry cough and shortness of breath and generalized malaise and was found to be hypoxic to the 60s.  She was placed on O2 at 3 L with improvement to the 90s and sent to the ED.  Patient denies chest pain, lower extremity pain or swelling and denies fever or chills and denies abdominal pain, diarrhea or dysuria.  She is compliant with her Eliquis.  ED course and data review: afebrile with pulse in the 70s to 80s, respirations 22, BP 112/49 and O2 sat initially 87% on 3 L improving to high 90s on 4 L.  WBC 8200 with lactic acid 1.4.  Respiratory viral panel negative for COVID flu and RSV.  Troponin 21 and BNP 27.  BMP significant for creatinine of 1.46 above baseline of 0.98, with bicarb of 18 and sodium 128.  EKG, personally viewed and interpreted showing NSR at 96 with no acute ST-T wave changes. CTA chest negative for PE and showing multifocal pneumonia versus metastatic disease.  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  Patient was started on Levaquin due to history of severe penicillin allergy and given an IV fluid bolus of NS and hospitalist consulted for admission.   Assessment and Plan:  CAP Acute hypoxic respiratory failure Continues to have significant WOB, accessory muscle use and high dependence on oxygen. On 15L HFNC. Endorses dyspnea while at rest. Speaking in only a couple words at a time.  Respiratory viral panel negative CTA showing multifocal bilateral airspace disease favor  pneumonia over metastatic disease. - FYI to CCM for potential to needing intubation today - start Bipap -Continue Levaquin -Continue supplemental oxygen -Antitussives, incentive spirometer -DuoNebs as needed  AKI (acute kidney injury)- resolved Metabolic acidosis -resolved Hyponatremia -resolved Suspect prerenal and related to poor oral intake from acute illness IV hydration with NS Monitor renal function and serum sodium  Atrial fibrillation, chronic (HCC) Continue Eliquis and metoprolol  Breast cancer metastasized to axillary lymph node, right ([facility]) Patient is on oral chemotherapy Continue  Essential hypertension Continue lisinopril and metoprolol - consider holding BB  Subjective: Patient is able to speak in only few word sentences. Endorses significant SOB.   Physical Exam: Vitals:   03/15/22 2000 03/15/22 2020 03/15/22 2334 03/16/22 0355  BP:  (!) 92/49 (!) 104/55 117/65  Pulse:  72 80 79  Resp:  '19 20 20  '$ Temp:  97.6 F (36.4 C) 97.7 F (36.5 C) 97.7 F (36.5 C)  TempSrc:  Oral Oral Oral  SpO2: 98% 95% 97% 94%  Weight:      Height:       Physical Exam Constitutional:      Appearance: Normal appearance. She is obese.  HENT:     Head: Normocephalic and atraumatic.     Nose: Nose normal.  Eyes:     Pupils: Pupils are equal, round, and reactive to light.  Cardiovascular:     Rate and Rhythm: Normal rate.  Pulmonary:     Effort: Respiratory distress present.  Abdominal:  General: Bowel sounds are normal.     Palpations: Abdomen is soft.  Musculoskeletal:        General: Normal range of motion.     Cervical back: Normal range of motion.  Skin:    General: Skin is warm.  Neurological:     General: No focal deficit present.     Mental Status: She is alert.  Psychiatric:        Mood and Affect: Mood normal.     Data Reviewed:  CBC    Component Value Date/Time   WBC 9.3 03/16/2022 0613   RBC 3.51 (L) 03/16/2022 0613   HGB 10.5 (L) 03/16/2022  0613   HCT 32.3 (L) 03/16/2022 0613   PLT 245 03/16/2022 0613   MCV 92.0 03/16/2022 0613   MCH 29.9 03/16/2022 0613   MCHC 32.5 03/16/2022 0613   RDW 15.5 03/16/2022 0613      Latest Ref Rng & Units 03/16/2022    6:13 AM 03/15/2022    4:11 AM 03/14/2022    3:28 AM  BMP  Glucose 70 - 99 mg/dL 113  107  118   BUN 8 - 23 mg/dL '9  8  14   '$ Creatinine 0.44 - 1.00 mg/dL 0.90  1.00  1.58   Sodium 135 - 145 mmol/L 136  136  135   Potassium 3.5 - 5.1 mmol/L 3.8  3.1  3.2   Chloride 98 - 111 mmol/L 103  105  102   CO2 22 - 32 mmol/L '24  24  22   '$ Calcium 8.9 - 10.3 mg/dL 8.8  8.3  8.3     Family Communication: None by bedside. Pt Aox3 understands her condition and conversant with family.   Disposition: Status is: Inpatient Remains inpatient appropriate because: Pneumonia in the setting of lung cancer and increased O2 requirement.   Planned Discharge Destination: Home  Time spent: 30 minutes  Author: Richarda Osmond, MD 03/16/2022 7:18 AM  For on call review [email or web address].

## 2022-03-16 NOTE — Progress Notes (Addendum)
Patients family member called nurse into the room because he said she was having trouble breathing.  Upon arrival in the room patient was laying completely flat on her bag and was on 10LHFNC.  Appears to be in respiratory distress with hard work of breathing.  With assistance of NT pulled patient up in the bed, and sat her straight up. Put a non-rebreather on for a couple of minutes until her breath came back and then gave her a breathing treatment.  Patient currently on 15L HFNC and oxygen saturations are 92%.    Addendum: patient still having hard WOB. Maxed out on HFNC.  Respiratory at bedside and was going to initiate heated high flow but then orders for bipap were initiated.  Patient on bipap at this time.

## 2022-03-17 ENCOUNTER — Inpatient Hospital Stay: Payer: Medicare Other

## 2022-03-17 ENCOUNTER — Inpatient Hospital Stay
Admit: 2022-03-17 | Discharge: 2022-03-17 | Disposition: A | Discharge status: Home / Self Care | Payer: Medicare Other | Attending: Pulmonary Disease | Admitting: Pulmonary Disease

## 2022-03-17 ENCOUNTER — Inpatient Hospital Stay: Payer: Self-pay

## 2022-03-17 DIAGNOSIS — N179 Acute kidney failure, unspecified: Secondary | ICD-10-CM | POA: Diagnosis not present

## 2022-03-17 DIAGNOSIS — C773 Secondary and unspecified malignant neoplasm of axilla and upper limb lymph nodes: Secondary | ICD-10-CM | POA: Diagnosis not present

## 2022-03-17 DIAGNOSIS — J9601 Acute respiratory failure with hypoxia: Secondary | ICD-10-CM | POA: Diagnosis not present

## 2022-03-17 DIAGNOSIS — C50911 Malignant neoplasm of unspecified site of right female breast: Secondary | ICD-10-CM | POA: Diagnosis not present

## 2022-03-17 DIAGNOSIS — Z515 Encounter for palliative care: Secondary | ICD-10-CM | POA: Diagnosis not present

## 2022-03-17 DIAGNOSIS — J189 Pneumonia, unspecified organism: Secondary | ICD-10-CM | POA: Diagnosis not present

## 2022-03-17 LAB — PROCALCITONIN: Procalcitonin: 0.5 ng/mL

## 2022-03-17 LAB — RESPIRATORY PANEL BY PCR

## 2022-03-17 LAB — CBC
HCT: 31.2 % — ABNORMAL LOW (ref 36.0–46.0)
Hemoglobin: 10.5 g/dL — ABNORMAL LOW (ref 12.0–15.0)
MCH: 30.8 pg (ref 26.0–34.0)
MCHC: 33.7 g/dL (ref 30.0–36.0)
MCV: 91.5 fL (ref 80.0–100.0)
Platelets: 188 10*3/uL (ref 150–400)
RBC: 3.41 MIL/uL — ABNORMAL LOW (ref 3.87–5.11)
RDW: 15.6 % — ABNORMAL HIGH (ref 11.5–15.5)
WBC: 8.3 10*3/uL (ref 4.0–10.5)
nRBC: 0 % (ref 0.0–0.2)

## 2022-03-17 LAB — RENAL FUNCTION PANEL
Albumin: 2.3 g/dL — ABNORMAL LOW (ref 3.5–5.0)
Anion gap: 9 (ref 5–15)
BUN: 18 mg/dL (ref 8–23)
CO2: 27 mmol/L (ref 22–32)
Calcium: 8.9 mg/dL (ref 8.9–10.3)
Chloride: 101 mmol/L (ref 98–111)
Creatinine, Ser: 0.92 mg/dL (ref 0.44–1.00)
GFR, Estimated: >60 mL/min (ref >60)
Glucose, Bld: 131 mg/dL — ABNORMAL HIGH (ref 70–99)
Phosphorus: 2.9 mg/dL (ref 2.5–4.6)
Potassium: 3.2 mmol/L — ABNORMAL LOW (ref 3.5–5.1)
Sodium: 137 mmol/L (ref 135–145)

## 2022-03-17 LAB — MYCOPLASMA PNEUMONIAE ANTIBODY, IGM: Mycoplasma pneumo IgM: <770 U/mL (ref 0–769)

## 2022-03-17 LAB — MAGNESIUM: Magnesium: 2.1 mg/dL (ref 1.7–2.4)

## 2022-03-17 LAB — LEGIONELLA PNEUMOPHILA SEROGP 1 UR AG: L. pneumophila Serogp 1 Ur Ag: NEGATIVE

## 2022-03-17 LAB — C-REACTIVE PROTEIN: CRP: 21.4 mg/dL — ABNORMAL HIGH (ref <1.0)

## 2022-03-17 MED ORDER — SODIUM CHLORIDE 0.9 % IV SOLN: INTRAVENOUS | Status: DC | PRN

## 2022-03-17 MED ORDER — SULFAMETHOXAZOLE-TRIMETHOPRIM 800-160 MG PO TABS
2.0000 | ORAL_TABLET | Freq: Three times a day (TID) | ORAL | Status: DC
  Administered 2022-03-17 – 2022-03-18 (×3): 2 via ORAL
  Filled 2022-03-17 (×4): qty 2

## 2022-03-17 MED ORDER — POTASSIUM CHLORIDE 10 MEQ/100ML IV SOLN
10.0000 meq | INTRAVENOUS | Status: AC
  Administered 2022-03-17 (×4): 10 meq via INTRAVENOUS
  Filled 2022-03-17 (×4): qty 100

## 2022-03-17 MED ORDER — METHYLPREDNISOLONE SODIUM SUCC 40 MG IJ SOLR: 40.0000 mg | Freq: Two times a day (BID) | INTRAMUSCULAR | Status: DC

## 2022-03-17 MED ORDER — POTASSIUM CHLORIDE CRYS ER 20 MEQ PO TBCR
40.0000 meq | EXTENDED_RELEASE_TABLET | Freq: Once | ORAL | Status: AC
  Administered 2022-03-17: 40 meq via ORAL
  Filled 2022-03-17: qty 2

## 2022-03-17 MED ORDER — IPRATROPIUM-ALBUTEROL 0.5-2.5 (3) MG/3ML IN SOLN
3.0000 mL | Freq: Four times a day (QID) | RESPIRATORY_TRACT | Status: DC
  Administered 2022-03-17 – 2022-03-22 (×20): 3 mL via RESPIRATORY_TRACT
  Filled 2022-03-17 (×20): qty 3

## 2022-03-17 MED ORDER — BUDESONIDE 0.25 MG/2ML IN SUSP
0.2500 mg | Freq: Two times a day (BID) | RESPIRATORY_TRACT | Status: DC
  Administered 2022-03-17 – 2022-03-21 (×10): 0.25 mg via RESPIRATORY_TRACT
  Filled 2022-03-17 (×10): qty 2

## 2022-03-17 NOTE — Progress Notes (Addendum)
Arrived to discuss bedside PICC placement including risks, benefits, and alternatives. Patient refused PICC stating she saw the risks greater than benefits. She asked to discuss with her MD on AM rounds. She also indicated she would "like to go home rather than spend her last days in a hospital." Allowed patient to verbalize x 15 minutes to provide emotional support. Patient reaffirmed her decision to refuse PICC after that time. Notified Biomedical scientist.

## 2022-03-17 NOTE — Progress Notes (Signed)
[place]  [phone] [phone]  ID: Claria Dice OB: 1945-04-09  MR#: [id number]  CWC#:[id number]  Patient Care Team: Derinda Late, MD as PCP - General (Family Medicine)  CHIEF COMPLAINT: Stage IV ER/PR breast cancer, acute respiratory failure.   INTERVAL HISTORY: Patient still requiring high flow O2, but her breathing appears less labored and she is able to talk in full sentences today.  Patient endorses feeling better with less shortness of breath.  She offers no other complaints.  REVIEW OF SYSTEMS:   Review of Systems  Constitutional:  Positive for malaise/fatigue. Negative for fever and weight loss.  Respiratory:  Positive for cough and shortness of breath. Negative for hemoptysis.   Cardiovascular: Negative.  Negative for chest pain.  Gastrointestinal: Negative.  Negative for abdominal pain.  Genitourinary: Negative.  Negative for dysuria.  Musculoskeletal: Negative.  Negative for back pain.  Skin: Negative.  Negative for rash.  Neurological:  Positive for weakness. Negative for dizziness, focal weakness and headaches.  Psychiatric/Behavioral: Negative.  The patient is not nervous/anxious.     As per HPI. Otherwise, a complete review of systems is negative.  PAST MEDICAL HISTORY: Past Medical History:  Diagnosis Date   Dysrhythmia    afib   Stroke [facility])     PAST SURGICAL HISTORY: Past Surgical History:  Procedure Laterality Date   BREAST BIOPSY Right 08/12/2021   [place] bx 10:00 coil marker, [place] bx 12:00 ribbon marker, [place] bx axilla LN, hydro marker, path pending   COLONOSCOPY N/A 03/27/2021   Procedure: COLONOSCOPY;  Surgeon: Annamaria Helling, DO;  Location: [place];  Service: Gastroenterology;  Laterality: N/A;   ESOPHAGOGASTRODUODENOSCOPY N/A 03/27/2021   Procedure: ESOPHAGOGASTRODUODENOSCOPY (EGD);  Surgeon: Annamaria Helling, DO;  Location: [facility] ENDOSCOPY;  Service: Gastroenterology;  Laterality: N/A;    HERNIA REPAIR     umbilical    FAMILY HISTORY: Family History  Problem Relation Age of Onset   Breast cancer Neg Hx     ADVANCED DIRECTIVES (Y/N):  '@ADVDIR'$ @  HEALTH MAINTENANCE: Social History   Tobacco Use   Smoking status: Former    Packs/day: 0.50    Years: 45.00    Total pack years: 22.50    Types: Cigarettes    Quit date: 02/27/2018    Years since quitting: 4.0  Vaping Use   Vaping Use: Never used  Substance Use Topics   Drug use: Never     Colonoscopy:  PAP:  Bone density:  Lipid panel:  Allergies  Allergen Reactions   Penicillin G Hives and Shortness Of Breath    Current Facility-Administered Medications  Medication Dose Route Frequency Provider Last Rate Last Admin   0.9 %  sodium chloride infusion   Intravenous PRN Richarda Osmond, MD 5 mL/hr at 03/17/22 1100 Infusion Verify at 03/17/22 1100   acetaminophen (TYLENOL) tablet 650 mg  650 mg Oral Q6H PRN Athena Masse, MD   650 mg at 03/15/22 1625   Or   acetaminophen (TYLENOL) suppository 650 mg  650 mg Rectal Q6H PRN Athena Masse, MD   650 mg at 03/16/22 1323   albuterol (PROVENTIL) (2.5 MG/3ML) 0.083% nebulizer solution 2.5 mg  2.5 mg Nebulization Q4H PRN Athena Masse, MD   2.5 mg at 03/16/22 1111   apixaban (ELIQUIS) tablet 5 mg  5 mg Oral BID Athena Masse, MD   5 mg at 03/17/22 1118   budesonide (PULMICORT) nebulizer solution 0.25 mg  0.25 mg Nebulization BID Doristine Mango  L, MD   0.25 mg at 03/17/22 0846   Chlorhexidine Gluconate Cloth 2 % PADS 6 each  6 each Topical Daily Richarda Osmond, MD   6 each at 03/16/22 1331   guaiFENesin-dextromethorphan (ROBITUSSIN DM) 100-10 MG/5ML syrup 10 mL  10 mL Oral Q4H PRN Athena Masse, MD   10 mL at 03/16/22 1127   ipratropium-albuterol (DUONEB) 0.5-2.5 (3) MG/3ML nebulizer solution 3 mL  3 mL Nebulization Q6H Doristine Mango L, MD   3 mL at 03/17/22 1315   levofloxacin (LEVAQUIN) IVPB 750 mg  750 mg Intravenous Q24H Lorin Picket, [place] at 03/16/22 2116   [START ON 03/18/2022] methylPREDNISolone sodium succinate (SOLU-MEDROL) 40 mg/mL injection 40 mg  40 mg Intravenous Q12H Flora Lipps, MD       metoprolol succinate (TOPROL-XL) 24 hr tablet 12.5 mg  12.5 mg Oral Daily Judd Gaudier V, MD   12.5 mg at 03/17/22 1119   ondansetron (ZOFRAN) tablet 4 mg  4 mg Oral Q6H PRN Athena Masse, MD       Or   ondansetron [facility]) injection 4 mg  4 mg Intravenous Q6H PRN Athena Masse, MD   4 mg at 03/14/22 1723   simvastatin (ZOCOR) tablet 10 mg  10 mg Oral q1800 Athena Masse, MD   10 mg at 03/15/22 1643   sulfamethoxazole-trimethoprim (BACTRIM DS) 800-160 MG per tablet 2 tablet  2 tablet Oral Q8H Flora Lipps, MD        OBJECTIVE: Vitals:   03/17/22 1200 03/17/22 1315  BP: (!) 118/58   Pulse: 76   Resp: 20   Temp:    SpO2: 96% 95%     Body mass index is 36.11 kg/m.    ECOG FS:4 - Bedbound  General: Ill-appearing, mild respiratory distress. Eyes: Pink conjunctiva, anicteric sclera. HEENT: Normocephalic, moist mucous membranes. Lungs: No audible wheezing or coughing. Heart: Regular rate and rhythm. Abdomen: Soft, nontender, no obvious distention. Musculoskeletal: No edema, cyanosis, or clubbing. Neuro: Alert, answering all questions appropriately. Cranial nerves grossly intact. Skin: No rashes or petechiae noted. Psych: Normal affect.  LAB RESULTS:  Lab Results  Component Value Date   NA 137 03/17/2022   K 3.2 (L) 03/17/2022   CL 101 03/17/2022   CO2 27 03/17/2022   GLUCOSE 131 (H) 03/17/2022   BUN 18 03/17/2022   CREATININE 0.92 03/17/2022   CALCIUM 8.9 03/17/2022   PROT 6.0 (L) 03/16/2022   ALBUMIN 2.3 (L) 03/17/2022   AST 24 03/16/2022   ALT 32 03/16/2022   ALKPHOS 121 03/16/2022   BILITOT 0.8 03/16/2022   GFRNONAA >60 03/17/2022    Lab Results  Component Value Date   WBC 8.3 03/17/2022   HGB 10.5 (L) 03/17/2022   HCT 31.2 (L) 03/17/2022   MCV 91.5 03/17/2022   PLT 188  03/17/2022     STUDIES: DG Chest Port 1 View  Result Date: 03/17/2022 CLINICAL DATA:  Shortness of breath EXAM: PORTABLE CHEST 1 VIEW COMPARISON:  Chest x-ray dated March 15, 2022 FINDINGS: Cardiac and mediastinal contours unchanged. Diffuse bilateral heterogeneous opacities, not significantly changed when compared with prior exam. No large pleural effusion or evidence of pneumothorax. IMPRESSION: Diffuse bilateral heterogeneous opacities, not significantly changed when compared with prior exam, differential considerations include multifocal infection or inflammatory etiology such as drug reaction. Electronically Signed   By: Yetta Glassman M.D.   On: 03/17/2022 09:37   DG Chest Port 1 View  Result Date: 03/15/2022  CLINICAL DATA:  Progressive shortness of breath. EXAM: PORTABLE CHEST 1 VIEW COMPARISON:  04/06/2022 FINDINGS: Heart size and mediastinal contours are unremarkable. Aortic atherosclerotic calcifications. Lung volumes are low. Progressive, bilateral upper and lower lobe interstitial and airspace opacities. IMPRESSION: Progressive, bilateral upper and lower lobe interstitial and airspace opacities compatible with multifocal pneumonia. Electronically Signed   By: Kerby Moors M.D.   On: 03/15/2022 17:32   CT Angio Chest PE W and/or Wo Contrast  Result Date: 03/24/2022 CLINICAL DATA:  Cough for 2 weeks, hypoxia, history of stage IV lung and breast cancer EXAM: CT ANGIOGRAPHY CHEST WITH CONTRAST TECHNIQUE: Multidetector CT imaging of the chest was performed using the standard protocol during bolus administration of intravenous contrast. Multiplanar CT image reconstructions and MIPs were obtained to evaluate the vascular anatomy. RADIATION DOSE REDUCTION: This exam was performed according to the departmental dose-optimization program which includes automated exposure control, adjustment of the mA and/or kV according to patient size and/or use of iterative reconstruction technique. CONTRAST:   39m OMNIPAQUE IOHEXOL 350 MG/ML SOLN COMPARISON:  03/25/2022 FINDINGS: Cardiovascular: This is a technically adequate evaluation of the pulmonary vasculature. No filling defects or pulmonary emboli. The heart is unremarkable without pericardial effusion. No evidence of thoracic aortic aneurysm or dissection. Atherosclerosis of the aorta and coronary vasculature. Mediastinum/Nodes: No enlarged mediastinal, hilar, or axillary lymph nodes. Thyroid gland, trachea, and esophagus demonstrate no significant findings. Small hiatal hernia. Lungs/Pleura: There is diffuse increased interstitial prominence, with multifocal bilateral airspace disease as seen on preceding chest x-ray. I would favor multifocal bilateral pneumonia such as COVID-19 over metastatic disease. No effusion or pneumothorax. Central airways are patent. Upper Abdomen: Nodular thickening of the left adrenal gland measuring up to 18 mm, suspicious for metastatic disease in a patient with a history of lung and breast cancer. No other acute upper abdominal findings. Musculoskeletal: No acute or destructive bony lesions. Reconstructed images demonstrate no additional findings. Review of the MIP images confirms the above findings. IMPRESSION: 1. No evidence of pulmonary embolus. 2. Multifocal bilateral airspace disease, favor pneumonia over metastatic disease. Atypical viral etiologies such as COVID-19 should be considered based on pattern of findings. 3. Nodular thickening left adrenal gland, concerning for metastatic disease in a patient with a history of breast and lung cancer. 4.  Aortic Atherosclerosis (ICD10-I70.0). Electronically Signed   By: MRanda NgoM.D.   On: 03/09/2022 23:24   DG Chest 2 View  Result Date: 03/16/2022 CLINICAL DATA:  Cough. Decreased oxygen saturation. History of stage IV lung and breast cancer. EXAM: CHEST - 2 VIEW COMPARISON:  None Available. FINDINGS: Heart size and mediastinal contours are unremarkable. Aortic  atherosclerotic calcifications. Bilateral upper and lower lobe airspace opacities with diffuse increase interstitial markings identified. No signs of pleural effusion. Visualized osseous structures appear intact. IMPRESSION: Bilateral upper and lower lobe airspace opacities with diffuse increase interstitial markings. In the acute setting, imaging findings are concerning for multifocal pneumonia with superimposed interstitial edema. However, in the setting of stage IV lung cancer and breast cancer findings are less specific and may also be seen with diffuse pulmonary metastasis. Electronically Signed   By: TKerby MoorsM.D.   On: 04/07/2022 14:55    ASSESSMENT: Stage IV ER/PR breast cancer, acute respiratory failure.   PLAN:    Stage IV ER/PR breast cancer: Patient initially diagnosed in June 2023.  CT scan on September 24, 2021 concerning for metastatic disease with pulmonary nodules and hilar lymphadenopathy.  Bronchoscopy one week later confirmed metastatic disease.  It appears patient finally initiated treatment with letrozole and Verenzio on October 17, 2021.  Patient has been tolerating her treatments well only with some mild diarrhea.  Her only other complaint upon evaluation at [facility] on February 19, 2022 was chronic back pain unrelated to her malignancy.  Patient has not had any restaging CT scans until recently.  Would be highly unusual to acutely present with lymphangitic spread of disease.  Also interstitial disease and pneumonitis associated with Isac Sarna is highly unusual and incidence is likely less than 1%.  Patient has been instructed to hold her treatment until she is evaluated at [facility] upon discharge. Respiratory failure: Improving with IV steroids and IV Lasix.  Unclear etiology. COVID and RSV are negative.  CT suggests infectious origin as do the acute onset of symptoms.   Anemia: Mild, monitor. Disposition: Patient has been instructed to hold her treatment until she is  evaluated by her primary oncologist at [facility] upon discharge.   Appreciate consult, will follow.  Lloyd Huger, MD   03/17/2022 2:35 PM

## 2022-03-17 NOTE — Progress Notes (Signed)
Patient struggling on bipap. Bipap alarming high volume continuously as volumes noted to be 1500 to 2000. Patient complaining of mouth dryness. Placed on heated high flow. Bipap on standby. Will continue to monitor.

## 2022-03-17 NOTE — Progress Notes (Addendum)
Progress Note   Erica Waters: Erica Waters DOB: 1945-08-31 DOA: 04/03/2022     4 DOS: the Erica Waters was seen and examined on 03/17/2022   Brief hospital course: Erica Waters is a 77 y.o. 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 and a former smoker who presents to the ED from urgent care for evaluation of hypoxia.  She presented there with a 2-week history of a dry cough and shortness of breath and generalized malaise and was found to be hypoxic to the 60s.  She was placed on O2 at 3 L with improvement to the 90s and sent to the ED.  Erica Waters denies chest pain, lower extremity pain or swelling and denies fever or chills and denies abdominal pain, diarrhea or dysuria.  She is compliant with her Eliquis.  ED course and data review: afebrile with pulse in the 70s to 80s, respirations 22, BP 112/49 and O2 sat initially 87% on 3 L improving to high 90s on 4 L.  WBC 8200 with lactic acid 1.4.  Respiratory viral panel negative for COVID flu and RSV.  Troponin 21 and BNP 27.  BMP significant for creatinine of 1.46 above baseline of 0.98, with bicarb of 18 and sodium 128.  EKG, personally viewed and interpreted showing NSR at 96 with no acute ST-T wave changes. CTA chest negative for PE and showing multifocal pneumonia versus metastatic disease.  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  Erica Waters was started on Levaquin due to history of severe penicillin allergy and given an IV fluid bolus of NS and hospitalist consulted for admission.   1/8- put on BIPAP for severe WOB 1/9- unable to tolerate BIPAP. Placed on highest HFNC support  Assessment and Plan:  CAP Acute hypoxic respiratory failure Clinically improved. Respiratory viral panel negative CTA showing multifocal bilateral airspace disease favor pneumonia over metastatic disease. - FYI to CCM for  potential to needing intubation, appreciate your help - oncology consult for possible metastatic component/contribution - scheduled duonebs and steroid nebulizer -Continue Levaquin -Continue supplemental oxygen and wean as tolerated -Antitussives, incentive spirometer -DuoNebs as needed - PT once has stabilized - repeat chest xray - IV steroids  AKI- resolved Metabolic acidosis -resolved Hyponatremia -resolved Suspect prerenal and related to poor oral intake from acute illness Monitor renal function and serum sodium  Hypokalemia- K+ 3.2 - replaced - limiting factor for diuresis  Atrial fibrillation, chronic (HCC) Continue Eliquis and metoprolol  Breast cancer metastasized to axillary lymph node, right ([place]) Erica Waters is on oral chemotherapy - oncology consulted  Essential hypertension Continue lisinopril and metoprolol - consider holding BB  Subjective: Erica Waters feeling improved. She was not able to tolerate bipap well. Denies chest pain. Feels less anxious about breathing today.   Physical Exam: Vitals:   03/17/22 0500 03/17/22 0600 03/17/22 0700 03/17/22 0800  BP: (!) 99/56 (!) 94/53 (!) 107/53 138/75  Pulse: 61 (!) 57 (!) 57 79  Resp: (!) 24 18 (!) 21 (!) 37  Temp:      TempSrc:      SpO2: 91% (!) 89% 91% (!) 62%  Weight:      Height:       Physical Exam Constitutional:      Appearance: Normal appearance. She is obese.  HENT:     Head: Normocephalic and atraumatic.     Nose: Nose normal.  Eyes:     Pupils: Pupils are equal,  round, and reactive to light.  Cardiovascular:     Rate and Rhythm: Normal rate.  Pulmonary:     Effort: Respiratory distress present.  Abdominal:     General: Bowel sounds are normal.     Palpations: Abdomen is soft.  Musculoskeletal:        General: Normal range of motion.     Cervical back: Normal range of motion.  Skin:    General: Skin is warm.  Neurological:     General: No focal deficit present.     Mental Status: She is  alert.  Psychiatric:        Mood and Affect: Mood normal.    Data Reviewed:  CBC    Component Value Date/Time   WBC 8.3 03/17/2022 0539   RBC 3.41 (L) 03/17/2022 0539   HGB 10.5 (L) 03/17/2022 0539   HCT 31.2 (L) 03/17/2022 0539   PLT 188 03/17/2022 0539   MCV 91.5 03/17/2022 0539   MCH 30.8 03/17/2022 0539   MCHC 33.7 03/17/2022 0539   RDW 15.6 (H) 03/17/2022 0539      Latest Ref Rng & Units 03/17/2022    5:39 AM 03/16/2022    6:13 AM 03/15/2022    4:11 AM  BMP  Glucose 70 - 99 mg/dL 131  113  107   BUN 8 - 23 mg/dL '18  9  8   '$ Creatinine 0.44 - 1.00 mg/dL 0.92  0.90  1.00   Sodium 135 - 145 mmol/L 137  136  136   Potassium 3.5 - 5.1 mmol/L 3.2  3.8  3.1   Chloride 98 - 111 mmol/L 101  103  105   CO2 22 - 32 mmol/L '27  24  24   '$ Calcium 8.9 - 10.3 mg/dL 8.9  8.8  8.3     Family Communication: None by bedside. Pt Aox3 understands her condition and conversant with family.   Disposition: Status is: Inpatient Remains inpatient appropriate because: significant oxygen dependence   Planned Discharge Destination: Home  Time spent: 30 minutes  Author: Richarda Osmond, MD 03/17/2022 8:10 AM  For on call review [email or web address].

## 2022-03-17 NOTE — Consult Note (Signed)
NAME:  Erica Waters, MRN:  [id number], DOB:  06-26-1945, LOS: 4 ADMISSION DATE:  03/19/2022   BRIEF SYNOPSIS RESP DISTRESS  History of Present Illness:  77 y.o. 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 and a former smoker who presents to the ED from urgent care for evaluation of hypoxia/SOB.     She presented there with a 2-week history of a dry cough and shortness of breath and generalized malaise and was found to be hypoxic to the 60s.  She was placed on O2 at 3 L with improvement to the 90s and sent to the ED.    ED course and data review: afebrile with pulse in the 70s to 80s, respirations 22, BP 112/49 and O2 sat initially 87% on 3 L improving to high 90s on 4 L.   wBC 8200 with lactic acid 1.4.   Respiratory viral panel negative for COVID flu and RSV.   Troponin 21 and BNP 27.   BMP significant for creatinine of 1.46 above baseline of 0.98, with bicarb of 18 and sodium 128.    CTA chest negative for PE and showing multifocal pneumonia versus metastatic disease.  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   Patient was started on Levaquin due to history of severe penicillin allergy and given an IV fluid bolus of NS   1/8 PCCM CONSULTED FOR SEVERE HYPOXIA AND WOB PLACED ON BIPAP  ONCOLOGY  Medical History   Oncology History Overview Note Denovo metastatic disease ER 99% PR 99% HER1 1+ FISH neg, to mediastinal LAD, stage IV Diagnosis: Screening mammo - abnormal R breast area 06/23/21 07/17/21 - diagnostic mammo - 3.1cm mass in R upper outer, abnormal R axillary LN 08/12/21 - Bx - c/w IDC, grade 2, LN bx + 09/24/21 - CT CAP concerning for hilar LAD and 2 pulmonary nodules c/f metastatic disease, bone scan negative 10/01/21 - bronchoscopy bx of mediastinal LN - metastatic adenocarcinoma c/w breast 10/17/21 - estradiol PET - ER+ disease in R breast  tissue, pulmonary and bone lesions, R axillary, mediastinal and bilateral hilar adenopathy 11/06/21 - started letrozole, plan for abemaciclib start '50mg'$  BID  Significant Hospital Events: Including procedures, antibiotic start and stop dates in addition to other pertinent events   ADMITTED 1/5 FOR HYPOXIA AND SOB 1/8 PCCM CONSULTED FOR SEVERE HYPOXIA 1/9 severe hypoxia      Micro Data:  COVID/FLU/RSV NEG   Antimicrobials:   Antibiotics Given (last 72 hours)     Date/Time Action Medication Dose Rate   03/15/22 1639 New Bag/Given   levofloxacin (LEVAQUIN) IVPB 750 mg 750 mg 100 mL/hr   03/16/22 1950 New Bag/Given   levofloxacin (LEVAQUIN) IVPB 750 mg 750 mg 100 mL/hr        EVENTS OVERNIGHT Remains on high flow [place] FiO2 (%):  [60 %-75 %] 60 % High risk for intubation     Latest Ref Rng & Units 03/17/2022    5:39 AM 03/16/2022    6:13 AM 03/15/2022    4:11 AM  BMP  Glucose 70 - 99 mg/dL 131  113  107   BUN 8 - 23 mg/dL '18  9  8   '$ Creatinine 0.44 - 1.00 mg/dL 0.92  0.90  1.00   Sodium 135 - 145 mmol/L 137  136  136   Potassium 3.5 - 5.1 mmol/L 3.2  3.8  3.1   Chloride 98 - 111  mmol/L 101  103  105   CO2 22 - 32 mmol/L '27  24  24   '$ Calcium 8.9 - 10.3 mg/dL 8.9  8.8  8.3       Objective   Blood pressure (!) 94/53, pulse (!) 57, temperature 98.3 F (36.8 C), resp. rate 18, height '5\' 5"'$  (1.651 m), weight 98.4 kg, SpO2 (!) 89 %.    FiO2 (%):  [60 %-75 %] 60 %   Intake/Output Summary (Last 24 hours) at 03/17/2022 0722 Last data filed at 03/17/2022 0000 Gross per 24 hour  Intake 528.82 ml  Output 1000 ml  Net -471.18 ml    Filed Weights   03/14/22 0053  Weight: 98.4 kg      Review of Systems: Resp:   +SOB Other:  All other systems negative    Physical Examination:   General Appearance: +distress  EYES PERRLA, EOM intact.   NECK Supple, No JVD Pulmonary: normal breath sounds, No wheezing.  CardiovascularNormal S1,S2.  No m/r/g.   Abdomen: Benign, Soft,  non-tender. ALL OTHER ROS ARE NEGATIVE   Labs/imaging that I havepersonally reviewed  (right click and "Reselect all SmartList Selections" daily)       ASSESSMENT AND PLAN SYNOPSIS  76 YO WHITE FEMALE WITH METASTATIC BREAST CANCER ON IMMUNOTHERAPY WITH PROGRESSIVE HYPOXIA AND RESP FAILURE, ETIOLOGIES INCLUDE DRUG INDUCED TOXICITY FROM IMMUNOTHERAPY, METASTATIC BEAST CANCER, INFECTIOUS CAUSES(ATYPICAL, PCP)    Severe ACUTE Hypoxic and Hypercapnic Respiratory Failure PLACED ON BIPAP-weaned to HFNC HIGH RISK FOR INTUBATION,CARDIAC ARREST CONSIDER LASIX THERAPY as BP tolerates Continue IV STEROIDS NEBS  FiO2 (%):  [60 %-75 %] 60 %   CARDIAC ICU monitoring  RENAL -continue Foley Catheter-assess need -Avoid nephrotoxic agents -Follow urine output, BMP -Ensure adequate renal perfusion, optimize oxygenation -Renal dose medications   Intake/Output Summary (Last 24 hours) at 03/17/2022 1308 Last data filed at 03/17/2022 0000 Gross per 24 hour  Intake 528.82 ml  Output 1000 ml  Net -471.18 ml     INFECTIOUS DISEASE -continue antibiotics as prescribed -follow up cultures  NUTRITIONAL STATUS DIET-->as tolerated Constipation protocol as indicated  ELECTROLYTES -follow labs as needed -replace as needed -pharmacy consultation and following   METASTATIC BREAST CANCER CONSULT ONCOLOGY    Best practice (right click and "Reselect all SmartList Selections" daily)  Diet: NPO Code Status:  FULL Disposition:ICU  Labs   CBC: Recent Labs  Lab 04/07/2022 1424 03/14/22 0328 03/15/22 0411 03/16/22 0613 03/17/22 0539  WBC 8.2 7.5 7.9 9.3 8.3  HGB 11.1* 10.2* 9.9* 10.5* 10.5*  HCT 34.4* 31.6* 29.4* 32.3* 31.2*  MCV 95.0 94.0 91.6 92.0 91.5  PLT 322 268 282 245 188     Basic Metabolic Panel: Recent Labs  Lab 03/25/2022 1424 03/14/22 0328 03/15/22 0411 03/16/22 0613 03/17/22 0539  NA 128* 135 136 136 137  K 3.5 3.2* 3.1* 3.8 3.2*  CL 98 102 105 103 101  CO2  18* '22 24 24 27  '$ GLUCOSE 133* 118* 107* 113* 131*  BUN '12 14 8 9 18  '$ CREATININE 1.46* 1.58* 1.00 0.90 0.92  CALCIUM 8.6* 8.3* 8.3* 8.8* 8.9  MG  --   --   --   --  2.1  PHOS  --   --   --   --  2.9    GFR: Estimated Creatinine Clearance: 61.4 mL/min (by C-G formula based on SCr of 0.92 mg/dL). Recent Labs  Lab 03/19/2022 1424 03/10/2022 2239 03/14/22 6578 03/15/22 0411 03/16/22 4696 03/16/22 1213 03/17/22 2952  PROCALCITON 0.22  --   --   --   --  0.43  --   WBC 8.2  --  7.5 7.9 9.3  --  8.3  LATICACIDVEN  --  1.4  --   --   --   --   --      Liver Function Tests: Recent Labs  Lab 03/15/22 0411 03/16/22 0613 03/17/22 0539  AST 34 24  --   ALT 40 32  --   ALKPHOS 92 121  --   BILITOT 0.7 0.8  --   PROT 5.8* 6.0*  --   ALBUMIN 2.3* 2.3* 2.3*    No results for input(s): "LIPASE", "AMYLASE" in the last 168 hours. No results for input(s): "AMMONIA" in the last 168 hours.  ABG    Component Value Date/Time   PHART 7.47 (H) 03/15/2022 1635   PCO2ART 34 03/15/2022 1635   PO2ART 52 (L) 03/15/2022 1635   HCO3 24.2 03/16/2022 1223   O2SAT 94.2 03/16/2022 1223     DVT/GI PRX  assessed I Assessed the need for Labs I Assessed the need for Foley I Assessed the need for Central Venous Line Family Discussion when available I Assessed the need for Mobilization I made an Assessment of medications to be adjusted accordingly Safety Risk assessment completed  CASE DISCUSSED IN MULTIDISCIPLINARY ROUNDS WITH ICU TEAM     Critical Care Time devoted to patient care services described in this note is 55 minutes.    Corrin Parker, M.D.  [facility]  Medical Director [facility] Director [facility] Cardio-Pulmonary Department

## 2022-03-17 NOTE — Progress Notes (Signed)
*  PRELIMINARY RESULTS* Echocardiogram 2D Echocardiogram has been performed.  Erica Waters 03/17/2022, 8:32 AM

## 2022-03-17 NOTE — Progress Notes (Signed)
Pharmacy Antibiotic Note  Erica Waters is a 77 y.o. female admitted on 03/12/2022 with pneumonia. PMH significant for PVD, HLD, HTN, AF (on Eliquis), breast cancer with metastasis to R axillary lymph nodes, MDD, former smoker. Patient has documented penicillin allergy without documentation of previously tolerating beta-lactams. Pharmacy has been consulted for levaquin dosing.    Plan: Day 4 of antibiotics Continue levofloxacin 750 mg IV Q24H Start Bactrim 2 DS tablets PO Q8H Continue to monitor renal function and follow culture results   Height: '5\' 5"'$  (165.1 cm) Weight: 98.4 kg (217 lb) IBW/kg (Calculated) : 57  Temp (24hrs), Avg:98.1 F (36.7 C), Min:97 F (36.1 C), Max:99.3 F (37.4 C)  Recent Labs  Lab 04/02/2022 1424 03/31/2022 2239 03/14/22 0328 03/15/22 0411 03/16/22 0613 03/17/22 0539  WBC 8.2  --  7.5 7.9 9.3 8.3  CREATININE 1.46*  --  1.58* 1.00 0.90 0.92  LATICACIDVEN  --  1.4  --   --   --   --      Estimated Creatinine Clearance: 61.4 mL/min (by C-G formula based on SCr of 0.92 mg/dL).    Allergies  Allergen Reactions   Penicillin G Hives and Shortness Of Breath    Antimicrobials this admission: 1/5 Levofloxacin >> 1/9 Bactrim >>  Dose adjustments this admission: 1/7 Levofloxacin 750 mg Q48H >> Levofloxacin 750 mg Q24H  Microbiology results: 1/5 BCx: NG4D 1/7 BCx: NG2D 1/8 MRSA PCR: negative  Thank you for allowing pharmacy to be a part of this patient's care.  Gretel Acre, PharmD PGY1 Pharmacy Resident 03/17/2022 2:08 PM

## 2022-03-18 ENCOUNTER — Inpatient Hospital Stay: Payer: Medicare Other

## 2022-03-18 DIAGNOSIS — Z515 Encounter for palliative care: Secondary | ICD-10-CM | POA: Diagnosis not present

## 2022-03-18 DIAGNOSIS — C773 Secondary and unspecified malignant neoplasm of axilla and upper limb lymph nodes: Secondary | ICD-10-CM | POA: Diagnosis not present

## 2022-03-18 DIAGNOSIS — N179 Acute kidney failure, unspecified: Secondary | ICD-10-CM | POA: Diagnosis not present

## 2022-03-18 DIAGNOSIS — J9601 Acute respiratory failure with hypoxia: Secondary | ICD-10-CM | POA: Diagnosis not present

## 2022-03-18 DIAGNOSIS — C50911 Malignant neoplasm of unspecified site of right female breast: Secondary | ICD-10-CM | POA: Diagnosis not present

## 2022-03-18 DIAGNOSIS — J189 Pneumonia, unspecified organism: Secondary | ICD-10-CM | POA: Diagnosis not present

## 2022-03-18 LAB — CBC
HCT: 33.1 % — ABNORMAL LOW (ref 36.0–46.0)
Hemoglobin: 11 g/dL — ABNORMAL LOW (ref 12.0–15.0)
MCH: 30.3 pg (ref 26.0–34.0)
MCHC: 33.2 g/dL (ref 30.0–36.0)
MCV: 91.2 fL (ref 80.0–100.0)
Platelets: 191 10*3/uL (ref 150–400)
RBC: 3.63 MIL/uL — ABNORMAL LOW (ref 3.87–5.11)
RDW: 15.2 % (ref 11.5–15.5)
WBC: 9.4 10*3/uL (ref 4.0–10.5)
nRBC: 0 % (ref 0.0–0.2)

## 2022-03-18 LAB — BLOOD GAS, ARTERIAL
Acid-Base Excess: 1.2 mmol/L (ref 0.0–2.0)
Bicarbonate: 29.3 mmol/L — ABNORMAL HIGH (ref 20.0–28.0)
FIO2: 60 %
MECHVT: 450 mL
Mechanical Rate: 18
O2 Saturation: 96.6 %
PEEP: 5 cmH2O
Patient temperature: 37
RATE: 18 resp/min
pCO2 arterial: 61 mmHg — ABNORMAL HIGH (ref 32–48)
pH, Arterial: 7.29 — ABNORMAL LOW (ref 7.35–7.45)
pO2, Arterial: 87 mmHg (ref 83–108)

## 2022-03-18 LAB — ECHOCARDIOGRAM COMPLETE
AR max vel: 2.45 cm2
AV Area VTI: 2.55 cm2
AV Area mean vel: 2.25 cm2
AV Mean grad: 5 mmHg
AV Peak grad: 9.5 mmHg
Ao pk vel: 1.54 m/s
Area-P 1/2: 2.47 cm2
Height: 65 in
S' Lateral: 2.1 cm
Weight: 3472 oz

## 2022-03-18 LAB — GLUCOSE, CAPILLARY
Glucose-Capillary: 178 mg/dL — ABNORMAL HIGH (ref 70–99)
Glucose-Capillary: 166 mg/dL — ABNORMAL HIGH (ref 70–99)
Glucose-Capillary: 144 mg/dL — ABNORMAL HIGH (ref 70–99)

## 2022-03-18 LAB — RENAL FUNCTION PANEL
Albumin: 2.4 g/dL — ABNORMAL LOW (ref 3.5–5.0)
Anion gap: 11 (ref 5–15)
BUN: 19 mg/dL (ref 8–23)
CO2: 25 mmol/L (ref 22–32)
Calcium: 8.9 mg/dL (ref 8.9–10.3)
Chloride: 101 mmol/L (ref 98–111)
Creatinine, Ser: 0.85 mg/dL (ref 0.44–1.00)
GFR, Estimated: >60 mL/min (ref >60)
Glucose, Bld: 136 mg/dL — ABNORMAL HIGH (ref 70–99)
Phosphorus: 2.8 mg/dL (ref 2.5–4.6)
Potassium: 3.9 mmol/L (ref 3.5–5.1)
Sodium: 137 mmol/L (ref 135–145)

## 2022-03-18 LAB — LEGIONELLA PNEUMOPHILA SEROGP 1 UR AG: L. pneumophila Serogp 1 Ur Ag: NEGATIVE

## 2022-03-18 LAB — CULTURE, BLOOD (ROUTINE X 2)
Culture: NO GROWTH
Special Requests: ADEQUATE

## 2022-03-18 LAB — MAGNESIUM: Magnesium: 2.3 mg/dL (ref 1.7–2.4)

## 2022-03-18 LAB — PROCALCITONIN: Procalcitonin: 0.3 ng/mL

## 2022-03-18 MED ORDER — INSULIN ASPART 100 UNIT/ML IJ SOLN
0.0000 [IU] | INTRAMUSCULAR | Status: DC
  Administered 2022-03-18: 3 [IU] via SUBCUTANEOUS
  Administered 2022-03-19: 2 [IU] via SUBCUTANEOUS
  Administered 2022-03-19: 5 [IU] via SUBCUTANEOUS
  Administered 2022-03-19 (×3): 3 [IU] via SUBCUTANEOUS
  Filled 2022-03-18 (×6): qty 1

## 2022-03-18 MED ORDER — SIMVASTATIN 20 MG PO TABS
10.0000 mg | ORAL_TABLET | Freq: Every day | ORAL | Status: DC
  Administered 2022-03-18: 10 mg
  Filled 2022-03-18: qty 1

## 2022-03-18 MED ORDER — METHYLPREDNISOLONE SODIUM SUCC 40 MG IJ SOLR
40.0000 mg | Freq: Two times a day (BID) | INTRAMUSCULAR | Status: DC
  Administered 2022-03-18 (×2): 40 mg via INTRAVENOUS
  Filled 2022-03-18 (×2): qty 1

## 2022-03-18 MED ORDER — PROPOFOL 1000 MG/100ML IV EMUL
5.0000 ug/kg/min | INTRAVENOUS | Status: DC
  Administered 2022-03-18: 5 ug/kg/min via INTRAVENOUS
  Administered 2022-03-19: 30 ug/kg/min via INTRAVENOUS
  Administered 2022-03-19: 15 ug/kg/min via INTRAVENOUS
  Administered 2022-03-19: 30 ug/kg/min via INTRAVENOUS
  Administered 2022-03-20 (×4): 40 ug/kg/min via INTRAVENOUS
  Administered 2022-03-20: 35 ug/kg/min via INTRAVENOUS
  Administered 2022-03-20 – 2022-03-21 (×2): 40 ug/kg/min via INTRAVENOUS
  Administered 2022-03-21 – 2022-03-22 (×8): 50 ug/kg/min via INTRAVENOUS
  Filled 2022-03-18 (×19): qty 100

## 2022-03-18 MED ORDER — ORAL CARE MOUTH RINSE
15.0000 mL | OROMUCOSAL | Status: DC
  Administered 2022-03-18 – 2022-03-22 (×47): 15 mL via OROMUCOSAL

## 2022-03-18 MED ORDER — FENTANYL 2500MCG IN NS 250ML (10MCG/ML) PREMIX INFUSION
25.0000 ug/h | INTRAVENOUS | Status: DC
  Administered 2022-03-18: 25 ug/h via INTRAVENOUS
  Administered 2022-03-19 – 2022-03-21 (×6): 200 ug/h via INTRAVENOUS
  Filled 2022-03-18 (×7): qty 250

## 2022-03-18 MED ORDER — SODIUM CHLORIDE 0.9% FLUSH
10.0000 mL | Freq: Two times a day (BID) | INTRAVENOUS | Status: DC
  Administered 2022-03-18 – 2022-03-20 (×5): 10 mL

## 2022-03-18 MED ORDER — NOREPINEPHRINE 16 MG/250ML-% IV SOLN
0.0000 ug/min | INTRAVENOUS | Status: DC
  Administered 2022-03-18: 2 ug/min via INTRAVENOUS
  Administered 2022-03-20: 8 ug/min via INTRAVENOUS
  Administered 2022-03-21: 17 ug/min via INTRAVENOUS
  Administered 2022-03-21: 24 ug/min via INTRAVENOUS
  Administered 2022-03-22: 32 ug/min via INTRAVENOUS
  Filled 2022-03-18 (×4): qty 250

## 2022-03-18 MED ORDER — ETOMIDATE 2 MG/ML IV SOLN
20.0000 mg | Freq: Once | INTRAVENOUS | Status: AC
  Administered 2022-03-18: 20 mg via INTRAVENOUS
  Filled 2022-03-18: qty 10

## 2022-03-18 MED ORDER — DOCUSATE SODIUM 50 MG/5ML PO LIQD
100.0000 mg | Freq: Two times a day (BID) | ORAL | Status: DC
  Administered 2022-03-18 – 2022-03-21 (×8): 100 mg
  Filled 2022-03-18 (×8): qty 10

## 2022-03-18 MED ORDER — ORAL CARE MOUTH RINSE: 15.0000 mL | OROMUCOSAL | Status: DC | PRN

## 2022-03-18 MED ORDER — FENTANYL CITRATE PF 50 MCG/ML IJ SOSY: 25.0000 ug | PREFILLED_SYRINGE | Freq: Once | INTRAMUSCULAR | Status: AC

## 2022-03-18 MED ORDER — ACETAMINOPHEN 650 MG RE SUPP: 650.0000 mg | Freq: Four times a day (QID) | RECTAL | Status: DC | PRN

## 2022-03-18 MED ORDER — FENTANYL CITRATE (PF) 100 MCG/2ML IJ SOLN
100.0000 ug | Freq: Once | INTRAMUSCULAR | Status: AC
  Administered 2022-03-18: 100 ug via INTRAVENOUS
  Filled 2022-03-18: qty 2

## 2022-03-18 MED ORDER — APIXABAN 5 MG PO TABS
5.0000 mg | ORAL_TABLET | Freq: Two times a day (BID) | ORAL | Status: DC
  Administered 2022-03-18 – 2022-03-21 (×7): 5 mg
  Filled 2022-03-18 (×7): qty 1

## 2022-03-18 MED ORDER — MORPHINE SULFATE (PF) 2 MG/ML IV SOLN
2.0000 mg | Freq: Once | INTRAVENOUS | Status: AC
  Administered 2022-03-18: 2 mg via INTRAVENOUS

## 2022-03-18 MED ORDER — MORPHINE SULFATE (PF) 2 MG/ML IV SOLN
2.0000 mg | INTRAVENOUS | Status: DC | PRN
  Filled 2022-03-18: qty 1

## 2022-03-18 MED ORDER — FENTANYL BOLUS VIA INFUSION
25.0000 ug | INTRAVENOUS | Status: DC | PRN
  Administered 2022-03-18: 50 ug via INTRAVENOUS
  Administered 2022-03-19 – 2022-03-22 (×8): 100 ug via INTRAVENOUS

## 2022-03-18 MED ORDER — SULFAMETHOXAZOLE-TRIMETHOPRIM 800-160 MG PO TABS
2.0000 | ORAL_TABLET | Freq: Three times a day (TID) | ORAL | Status: DC
  Administered 2022-03-18 – 2022-03-19 (×3): 2
  Filled 2022-03-18 (×4): qty 2

## 2022-03-18 MED ORDER — FAMOTIDINE IN NACL 20-0.9 MG/50ML-% IV SOLN
20.0000 mg | INTRAVENOUS | Status: DC
  Administered 2022-03-18 – 2022-03-21 (×4): 20 mg via INTRAVENOUS
  Filled 2022-03-18 (×4): qty 50

## 2022-03-18 MED ORDER — MIDAZOLAM HCL 2 MG/2ML IJ SOLN
1.0000 mg | INTRAMUSCULAR | Status: DC | PRN
  Administered 2022-03-18 – 2022-03-22 (×8): 2 mg via INTRAVENOUS
  Filled 2022-03-18 (×8): qty 2

## 2022-03-18 MED ORDER — ROCURONIUM BROMIDE 10 MG/ML (PF) SYRINGE
100.0000 mg | PREFILLED_SYRINGE | Freq: Once | INTRAVENOUS | Status: AC
  Administered 2022-03-18: 100 mg via INTRAVENOUS
  Filled 2022-03-18: qty 10

## 2022-03-18 MED ORDER — VITAL HIGH PROTEIN PO LIQD
1000.0000 mL | ORAL | Status: DC
  Administered 2022-03-18 – 2022-03-22 (×5): 1000 mL

## 2022-03-18 MED ORDER — ACETAMINOPHEN 325 MG PO TABS
650.0000 mg | ORAL_TABLET | Freq: Four times a day (QID) | ORAL | Status: DC | PRN
  Administered 2022-03-22: 650 mg
  Filled 2022-03-18: qty 2

## 2022-03-18 MED ORDER — FREE WATER
30.0000 mL | Status: DC
  Administered 2022-03-18 – 2022-03-19 (×7): 30 mL

## 2022-03-18 MED ORDER — POLYETHYLENE GLYCOL 3350 17 G PO PACK
17.0000 g | PACK | Freq: Every day | ORAL | Status: DC
  Administered 2022-03-18 – 2022-03-21 (×4): 17 g
  Filled 2022-03-18 (×4): qty 1

## 2022-03-18 NOTE — Progress Notes (Signed)
Pharmacy Antibiotic Note  Erica Waters is a 77 y.o. female admitted on 04/07/2022 with pneumonia. PMH significant for PVD, HLD, HTN, AF (on Eliquis), breast cancer with metastasis to R axillary lymph nodes, MDD, former smoker. Patient has documented penicillin allergy without documentation of previously tolerating beta-lactams. Morning of 1/10, patient having more trouble breathing and opted for intubation. Pharmacy has been consulted for levaquin dosing.    Plan: Day 5 of antibiotics Continue levofloxacin 750 mg IV Q24H Continue Bactrim 2 DS tablets PO Q8H Continue to monitor renal function and follow culture results   Height: '5\' 5"'$  (165.1 cm) Weight: 98.4 kg (217 lb) IBW/kg (Calculated) : 57  Temp (24hrs), Avg:98.1 F (36.7 C), Min:97.9 F (36.6 C), Max:98.2 F (36.8 C)  Recent Labs  Lab 04/03/2022 1424 03/16/2022 2239 03/14/22 0328 03/15/22 0411 03/16/22 0613 03/17/22 0539 03/18/22 0605  WBC 8.2  --  7.5 7.9 9.3 8.3 9.4  CREATININE 1.46*  --  1.58* 1.00 0.90 0.92  --   LATICACIDVEN  --  1.4  --   --   --   --   --      Estimated Creatinine Clearance: 61.4 mL/min (by C-G formula based on SCr of 0.92 mg/dL).    Allergies  Allergen Reactions   Penicillin G Hives and Shortness Of Breath    Antimicrobials this admission: 1/5 Levofloxacin >> 1/9 Bactrim >>  Dose adjustments this admission: 1/7 Levofloxacin 750 mg Q48H >> Levofloxacin 750 mg Q24H  Microbiology results: 1/5 BCx: NG final 1/7 BCx: NG3D 1/8 MRSA PCR: negative  Thank you for allowing pharmacy to be a part of this patient's care.  Gretel Acre, PharmD PGY1 Pharmacy Resident 03/18/2022 7:27 AM

## 2022-03-18 NOTE — Progress Notes (Signed)
Initial Nutrition Assessment  DOCUMENTATION CODES:   Obesity unspecified  INTERVENTION:   Vital HP '@60ml'$ /hr- Initiate at 86m/hr and increase by 142mhr q 8 hours until goal rate is reached.   Free water flushes 3069m4 hours to maintain tube patency   Regimen provides 1440kcal/day, 126g/day protein and 1384m63my of free water.   Pt at mild refeed risk; recommend monitor potassium, magnesium and phosphorus labs daily until stable  Daily weights  NUTRITION DIAGNOSIS:   Inadequate oral intake related to inability to eat (pt sedated and ventilated) as evidenced by NPO status.  GOAL:   Provide needs based on ASPEN/SCCM guidelines  MONITOR:   Vent status, Labs, Weight trends, TF tolerance, Skin, I & O's  REASON FOR ASSESSMENT:   Consult Enteral/tube feeding initiation and management  ASSESSMENT:   76 y73. female with medical history significant for PVD, HLD, hypertension, atrial fibrillation, MDD and metastatic breast cancer with metastasis to axillary lymph nodes who is admitted with PNA and AKI.  Pt sedated and ventilated. OGT in place. Will plan to initiate tube feeds today. Per chart, pt documented to be eating 75% of meals prior to intubation. Per chart, pt appears weight stable at baseline. Pt has not been weighed since admission. Palliative care following.    Medications reviewed and include: colace, solu-medrol, miralax, bactrim, pepcid, levofloxacin   Labs reviewed: K 3.9 wnl, P 2.8 wnl, Mg 2.3 wnl  Patient is currently intubated on ventilator support MV: 8.0 L/min Temp (24hrs), Avg:98.3 F (36.8 C), Min:97.9 F (36.6 C), Max:99.1 F (37.3 C)  Propofol: none   MAP- >65mm33m UOP- 2450ml 23mTRITION - FOCUSED PHYSICAL EXAM:  Flowsheet Row Most Recent Value  Orbital Region No depletion  Upper Arm Region No depletion  Thoracic and Lumbar Region No depletion  Buccal Region No depletion  Temple Region No depletion  Clavicle Bone Region No depletion   Clavicle and Acromion Bone Region No depletion  Scapular Bone Region No depletion  Dorsal Hand No depletion  Patellar Region No depletion  Anterior Thigh Region No depletion  Posterior Calf Region No depletion  Edema (RD Assessment) None  Hair Reviewed  Eyes Reviewed  Mouth Reviewed  Skin Reviewed  Nails Reviewed   Diet Order:   Diet Order     None      EDUCATION NEEDS:   No education needs have been identified at this time  Skin:  Skin Assessment: Reviewed RN Assessment (ecchymosis)  Last BM:  1/7  Height:   Ht Readings from Last 1 Encounters:  03/14/22 '5\' 5"'$  (1.651 m)    Weight:   Wt Readings from Last 1 Encounters:  03/14/22 98.4 kg    Ideal Body Weight:  56.8 kg  BMI:  Body mass index is 36.11 kg/m.  Estimated Nutritional Needs:   Kcal:  1082-1377kcal/day  Protein:  >115g/day  Fluid:  1.4-1.6L/day  Minal Stuller Koleen DistanceD, LDN Please refer to [facility] and/or RD on-call/weekend/after hours pager

## 2022-03-18 NOTE — Progress Notes (Incomplete)
Progress Note   Patient: Erica Waters:[id number] DOB: [birth year] DOA: 03/30/2022     5 DOS: the patient was seen and examined on 03/18/2022   Brief hospital course: Erica Waters is a 77 y.o. 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 and a former smoker who presents to the ED from urgent care for evaluation of hypoxia.  She presented there with a 2-week history of a dry cough and shortness of breath and generalized malaise and was found to be hypoxic to the 60s.  She was placed on O2 at 3 L with improvement to the 90s and sent to the ED.  Patient denies chest pain, lower extremity pain or swelling and denies fever or chills and denies abdominal pain, diarrhea or dysuria.  She is compliant with her Eliquis.  ED course and data review: afebrile with pulse in the 70s to 80s, respirations 22, BP 112/49 and O2 sat initially 87% on 3 L improving to high 90s on 4 L.  WBC 8200 with lactic acid 1.4.  Respiratory viral panel negative for COVID flu and RSV.  Troponin 21 and BNP 27.  BMP significant for creatinine of 1.46 above baseline of 0.98, with bicarb of 18 and sodium 128.  EKG, personally viewed and interpreted showing NSR at 96 with no acute ST-T wave changes. CTA chest negative for PE and showing multifocal pneumonia versus metastatic disease.  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  Patient was started on Levaquin due to history of severe penicillin allergy and given an IV fluid bolus of NS and hospitalist consulted for admission.   1/8- put on BIPAP for severe WOB 1/9- unable to tolerate BIPAP. Placed on highest HFNC support  Assessment and Plan:  CAP Acute hypoxic respiratory failure Clinically improved. Respiratory viral panel negative CTA showing multifocal bilateral airspace disease favor pneumonia over metastatic disease. - FYI to CCM for  potential to needing intubation, appreciate your help - oncology consult for possible metastatic component/contribution - scheduled duonebs and steroid nebulizer -Continue Levaquin -Continue supplemental oxygen and wean as tolerated -Antitussives, incentive spirometer -DuoNebs as needed - PT once has stabilized - repeat chest xray - IV steroids  AKI- resolved Metabolic acidosis -resolved Hyponatremia -resolved Suspect prerenal and related to poor oral intake from acute illness Monitor renal function and serum sodium  Hypokalemia- K+ 3.2 - replaced - limiting factor for diuresis  Atrial fibrillation, chronic (HCC) Continue Eliquis and metoprolol  Breast cancer metastasized to axillary lymph node, right ([place]) Patient is on oral chemotherapy - oncology consulted  Essential hypertension Continue lisinopril and metoprolol - consider holding BB  Subjective: Patient feeling improved. She was not able to tolerate bipap well. Denies chest pain. Feels less anxious about breathing today.   Physical Exam: Vitals:   03/18/22 0300 03/18/22 0400 03/18/22 0700 03/18/22 0800  BP: 112/61 122/65 (!) 111/51 (!) 109/51  Pulse: 92 90 81 93  Resp: (!) 25 (!) 29 (!) 23 (!) 27  Temp:    98.2 F (36.8 C)  TempSrc:    Oral  SpO2: 94% 94% 96% 96%  Weight:      Height:       Physical Exam Constitutional:      Appearance: Normal appearance. She is obese.  HENT:     Head: Normocephalic and atraumatic.     Nose: Nose normal.  Eyes:     Pupils: Pupils are equal, round,  and reactive to light.  Cardiovascular:     Rate and Rhythm: Normal rate.  Pulmonary:     Effort: Respiratory distress present.  Abdominal:     General: Bowel sounds are normal.     Palpations: Abdomen is soft.  Musculoskeletal:        General: Normal range of motion.     Cervical back: Normal range of motion.  Skin:    General: Skin is warm.  Neurological:     General: No focal deficit present.     Mental Status:  She is alert.  Psychiatric:        Mood and Affect: Mood normal.    Data Reviewed:  CBC    Component Value Date/Time   WBC 9.4 03/18/2022 0605   RBC 3.63 (L) 03/18/2022 0605   HGB 11.0 (L) 03/18/2022 0605   HCT 33.1 (L) 03/18/2022 0605   PLT 191 03/18/2022 0605   MCV 91.2 03/18/2022 0605   MCH 30.3 03/18/2022 0605   MCHC 33.2 03/18/2022 0605   RDW 15.2 03/18/2022 0605      Latest Ref Rng & Units 03/17/2022    5:39 AM 03/16/2022    6:13 AM 03/15/2022    4:11 AM  BMP  Glucose 70 - 99 mg/dL 131  113  107   BUN 8 - 23 mg/dL '18  9  8   '$ Creatinine 0.44 - 1.00 mg/dL 0.92  0.90  1.00   Sodium 135 - 145 mmol/L 137  136  136   Potassium 3.5 - 5.1 mmol/L 3.2  3.8  3.1   Chloride 98 - 111 mmol/L 101  103  105   CO2 22 - 32 mmol/L '27  24  24   '$ Calcium 8.9 - 10.3 mg/dL 8.9  8.8  8.3     Family Communication: None by bedside. Pt Aox3 understands her condition and conversant with family.   Disposition: Status is: Inpatient Remains inpatient appropriate because: significant oxygen dependence   Planned Discharge Destination: Home  Time spent: 30 minutes  Author: Richarda Osmond, MD 03/18/2022 8:16 AM  For on call review [email or web address].

## 2022-03-18 NOTE — Progress Notes (Signed)
   03/18/22 1615  Spiritual Encounters  Type of Visit Initial  Care provided to: Patient;Family  Conversation partners present during encounter Nurse  Referral source Nurse (RN/NT/LPN)  Reason for visit Advance directives  OnCall Visit Yes   Chaplain responded to [facility] consult for AD. Paperwork given to nurse to place in patient's file and will be used as needed. Chaplain services available upon request.

## 2022-03-18 NOTE — Progress Notes (Signed)
Peripherally Inserted Central Catheter Placement  The IV Nurse has discussed with the patient and/or persons authorized to consent for the patient, the purpose of this procedure and the potential benefits and risks involved with this procedure.  The benefits include less needle sticks, lab draws from the catheter, and the patient may be discharged home with the catheter. Risks include, but not limited to, infection, bleeding, blood clot (thrombus formation), and puncture of an artery; nerve damage and irregular heartbeat and possibility to perform a PICC exchange if needed/ordered by physician.  Alternatives to this procedure were also discussed.  Bard Power PICC patient education guide, fact sheet on infection prevention and patient information card has been provided to patient /or left at bedside.  Consent signed at bedside by husband due to altered mental status.  PICC inserted by Quin Hoop, RN  PICC Placement Documentation  PICC Double Lumen 93/90/30 Left Basilic 44 cm 1 cm (Active)  Indication for Insertion or Continuance of Line Prolonged intravenous therapies;Limited venous access - need for IV therapy >5 days (PICC only) 03/18/22 1347  Exposed Catheter (cm) 1 cm 03/18/22 1347  Site Assessment Clean, Dry, Intact 03/18/22 1347  Lumen #1 Status Flushed;Saline locked;Blood return noted 03/18/22 1347  Lumen #2 Status Flushed;Saline locked;Blood return noted 03/18/22 1347  Dressing Type Transparent;Securing device 03/18/22 1347  Dressing Status Antimicrobial disc in place;Clean, Dry, Intact 03/18/22 1347  Safety Lock Not Applicable 11/29/28 0762  Line Care Connections checked and tightened 03/18/22 1347  Line Adjustment (NICU/IV Team Only) No 03/18/22 1347  Dressing Intervention New dressing 03/18/22 1347  Dressing Change Due 03/25/22 03/18/22 [place], Nicolette Bang 03/18/2022, 1:48 PM

## 2022-03-18 NOTE — Progress Notes (Signed)
[place] at [facility] [phone] [phone]   Name: [name] Date: 03/18/2022 MRN: [id number]  DOB: [birth year]  Patient Care Team: Derinda Late, MD as PCP - General (Family Medicine)    REASON FOR CONSULTATION: Erica Waters is a 77 y.o. female with multiple medical problems including PVD, A-fib, stage IV ER/PR positive breast cancer on treatment at [facility] with Verzenio.  Patient was admitted to hospital 03/26/2022 with community-acquired pneumonia.  Hospitalization has been complicated by worsening respiratory failure requiring BiPAP.  Palliative care was consulted to address goals.   CODE STATUS: DNR  PAST MEDICAL HISTORY: Past Medical History:  Diagnosis Date   Dysrhythmia    afib   Stroke [facility])     PAST SURGICAL HISTORY:  Past Surgical History:  Procedure Laterality Date   BREAST BIOPSY Right 08/12/2021   [place] bx 10:00 coil marker, [place] bx 12:00 ribbon marker, [place] bx axilla LN, hydro marker, path pending   COLONOSCOPY N/A 03/27/2021   Procedure: COLONOSCOPY;  Surgeon: Annamaria Helling, DO;  Location: [place];  Service: Gastroenterology;  Laterality: N/A;   ESOPHAGOGASTRODUODENOSCOPY N/A 03/27/2021   Procedure: ESOPHAGOGASTRODUODENOSCOPY (EGD);  Surgeon: Annamaria Helling, DO;  Location: [facility] ENDOSCOPY;  Service: Gastroenterology;  Laterality: N/A;   HERNIA REPAIR     umbilical    HEMATOLOGY/ONCOLOGY HISTORY:  Oncology History   No history exists.    ALLERGIES:  is allergic to penicillin g.  MEDICATIONS:  Current Facility-Administered Medications  Medication Dose Route Frequency Provider Last Rate Last Admin   0.9 %  sodium chloride infusion   Intravenous PRN Richarda Osmond, MD   Stopped at 03/18/22 1705   acetaminophen (TYLENOL) tablet 650 mg  650 mg Per Tube Q6H PRN Benita Gutter, RPH       Or   acetaminophen (TYLENOL) suppository 650 mg  650 mg Rectal Q6H  PRN Benita Gutter, RPH       albuterol (PROVENTIL) (2.5 MG/3ML) 0.083% nebulizer solution 2.5 mg  2.5 mg Nebulization Q4H PRN Judd Gaudier V, MD   2.5 mg at 03/16/22 1111   apixaban (ELIQUIS) tablet 5 mg  5 mg Per Tube BID Benita Gutter, RPH       budesonide (PULMICORT) nebulizer solution 0.25 mg  0.25 mg Nebulization BID Doristine Mango L, MD   0.25 mg at 03/18/22 0739   Chlorhexidine Gluconate Cloth 2 % PADS 6 each  6 each Topical Daily Richarda Osmond, MD   6 each at 03/18/22 0941   docusate (COLACE) 50 MG/5ML liquid 100 mg  100 mg Per Tube BID Teressa Lower, NP   100 mg at 03/18/22 0917   famotidine (PEPCID) IVPB 20 mg premix  20 mg Intravenous Q24H Teressa Lower, NP   Stopped at 03/18/22 1014   feeding supplement (VITAL HIGH PROTEIN) liquid 1,000 mL  1,000 mL Per Tube Continuous Flora Lipps, MD 60 mL/hr at 03/18/22 1800 Infusion Verify at 03/18/22 1800   fentaNYL (SUBLIMAZE) bolus via infusion 25-100 mcg  25-100 mcg Intravenous Q15 min PRN Teressa Lower, NP   50 mcg at 03/18/22 1821   fentaNYL 2571mg in NS 2519m(1039mml) infusion-PREMIX  25-200 mcg/hr Intravenous Continuous NelTeressa LowerP 10 mL/hr at 03/18/22 1800 100 mcg/hr at 03/18/22 1800   free water 30 mL  30 mL Per Tube Q4H KasFlora LippsD   30 mL at 03/18/22 1706   guaiFENesin-dextromethorphan (ROBITUSSIN DM) 100-10 MG/5ML  syrup 10 mL  10 mL Oral Q4H PRN Athena Masse, MD   10 mL at 03/16/22 1127   ipratropium-albuterol (DUONEB) 0.5-2.5 (3) MG/3ML nebulizer solution 3 mL  3 mL Nebulization Q6H Richarda Osmond, MD   3 mL at 03/18/22 1313   levofloxacin (LEVAQUIN) IVPB 750 mg  750 mg Intravenous Q24H Lorin Picket, RPH 100 mL/hr at 03/18/22 1800 Infusion Verify at 03/18/22 1800   methylPREDNISolone sodium succinate (SOLU-MEDROL) 40 mg/mL injection 40 mg  40 mg Intravenous Q12H Benita Gutter, RPH   40 mg at 03/18/22 2876   midazolam (VERSED) injection 1-2 mg  1-2 mg Intravenous Q1H PRN Teressa Lower,  NP   2 mg at 03/18/22 1150   morphine (PF) 2 MG/ML injection 2 mg  2 mg Intravenous Q1H PRN Richarda Osmond, MD       ondansetron [facility]) tablet 4 mg  4 mg Oral Q6H PRN Athena Masse, MD       Or   ondansetron [facility]) injection 4 mg  4 mg Intravenous Q6H PRN Athena Masse, MD   4 mg at 03/14/22 1723   Oral care mouth rinse  15 mL Mouth Rinse Q2H Flora Lipps, MD   15 mL at 03/18/22 1706   Oral care mouth rinse  15 mL Mouth Rinse PRN Flora Lipps, MD       polyethylene glycol (MIRALAX / GLYCOLAX) packet 17 g  17 g Per Tube Daily Teressa Lower, NP   17 g at 03/18/22 0917   simvastatin (ZOCOR) tablet 10 mg  10 mg Per Tube q1800 Benita Gutter, RPH   10 mg at 03/18/22 1702   sodium chloride flush (NS) 0.9 % injection 10-40 mL  10-40 mL Intracatheter Q12H Flora Lipps, MD   10 mL at 03/18/22 1415   sulfamethoxazole-trimethoprim (BACTRIM DS) 800-160 MG per tablet 2 tablet  2 tablet Per Tube Q8H Benita Gutter, RPH   2 tablet at 03/18/22 1415    VITAL SIGNS: BP 139/67   Pulse (!) 104   Temp 98.9 F (37.2 C) (Oral)   Resp 20   Ht '5\' 5"'$  (1.651 m)   Wt 217 lb (98.4 kg)   SpO2 (!) 88%   BMI 36.11 kg/m  Filed Weights   03/14/22 0053  Weight: 217 lb (98.4 kg)    Estimated body mass index is 36.11 kg/m as calculated from the following:   Height as of this encounter: '5\' 5"'$  (1.651 m).   Weight as of this encounter: 217 lb (98.4 kg).  LABS: CBC:    Component Value Date/Time   WBC 9.4 03/18/2022 0605   HGB 11.0 (L) 03/18/2022 0605   HCT 33.1 (L) 03/18/2022 0605   PLT 191 03/18/2022 0605   MCV 91.2 03/18/2022 0605   Comprehensive Metabolic Panel:    Component Value Date/Time   NA 137 03/18/2022 0605   K 3.9 03/18/2022 0605   CL 101 03/18/2022 0605   CO2 25 03/18/2022 0605   BUN 19 03/18/2022 0605   CREATININE 0.85 03/18/2022 0605   GLUCOSE 136 (H) 03/18/2022 0605   CALCIUM 8.9 03/18/2022 0605   AST 24 03/16/2022 0613   ALT 32 03/16/2022 0613   ALKPHOS 121  03/16/2022 0613   BILITOT 0.8 03/16/2022 0613   PROT 6.0 (L) 03/16/2022 0613   ALBUMIN 2.4 (L) 03/18/2022 0605    RADIOGRAPHIC STUDIES: DG Abd 1 View  Result Date: 03/18/2022 CLINICAL DATA:  Nasal/orogastric tube placement. EXAM:  ABDOMEN - 1 VIEW COMPARISON:  None Available. FINDINGS: Nasal/orogastric tube passes below the diaphragm, curling within the mid stomach, well positioned. Normal bowel gas pattern. IMPRESSION: Well-positioned nasal/orogastric tube. Electronically Signed   By: Lajean Manes M.D.   On: 03/18/2022 10:16   DG Chest Port 1 View  Result Date: 03/18/2022 CLINICAL DATA:  ET tube placement. EXAM: PORTABLE CHEST 1 VIEW COMPARISON:  03/17/2022 and older exams. FINDINGS: Endotracheal tube tip projects 2.2 cm above the [place]. Nasal/orogastric tube passes below the diaphragm, well into the stomach and below the included field of view. Bilateral airspace lung opacities are without significant change from the most recent prior exam. No convincing pleural effusion and no pneumothorax. IMPRESSION: 1. Well-positioned endotracheal tube and nasal/orogastric tube. 2. No change in bilateral airspace lung opacities. Electronically Signed   By: Lajean Manes M.D.   On: 03/18/2022 10:16   ECHOCARDIOGRAM COMPLETE  Result Date: 03/18/2022    ECHOCARDIOGRAM REPORT   Patient Name:   Erica Waters Date of Exam: 03/17/2022 Medical Rec #:  [id number]         Height:       65.0 in Accession #:    [id number]        Weight:       217.0 lb Date of Birth:  Jan 09, 1946         BSA:          2.048 m Patient Age:    77 years          BP:           138/75 mmHg Patient Gender: F                 HR:           79 bpm. Exam Location:  [facility] Procedure: 2D Echo, Cardiac Doppler and Color Doppler Indications:     Acute respiratory distress                  R06.03  History:         Patient has no prior history of Echocardiogram examinations.                  Stroke.  Sonographer:     Sherrie Sport Referring Phys:  [id number]  Bradly Bienenstock Diagnosing Phys: Yolonda Kida MD  Sonographer Comments: Suboptimal apical window. IMPRESSIONS  1. TDS.  2. Left ventricular ejection fraction, by estimation, is 65 to 70%. The left ventricle has normal function. The left ventricle has no regional wall motion abnormalities. Left ventricular diastolic parameters are consistent with Grade I diastolic dysfunction (impaired relaxation).  3. Right ventricular systolic function is normal. The right ventricular size is normal.  4. The mitral valve is normal in structure. Trivial mitral valve regurgitation.  5. The aortic valve is normal in structure. Aortic valve regurgitation is not visualized. Conclusion(s)/Recommendation(s): Poor windows for evaluation of left ventricular function by transthoracic echocardiography. Would recommend an alternative means of evaluation. FINDINGS  Left Ventricle: Left ventricular ejection fraction, by estimation, is 65 to 70%. The left ventricle has normal function. The left ventricle has no regional wall motion abnormalities. The left ventricular internal cavity size was normal in size. There is  no left ventricular hypertrophy. Left ventricular diastolic parameters are consistent with Grade I diastolic dysfunction (impaired relaxation). Right Ventricle: The right ventricular size is normal. No increase in right ventricular wall thickness. Right ventricular systolic function is normal. Left Atrium: Left atrial size was normal  in size. Right Atrium: Right atrial size was normal in size. Pericardium: There is no evidence of pericardial effusion. Mitral Valve: The mitral valve is normal in structure. Trivial mitral valve regurgitation. Tricuspid Valve: The tricuspid valve is normal in structure. Tricuspid valve regurgitation is trivial. Aortic Valve: The aortic valve is normal in structure. Aortic valve regurgitation is not visualized. Aortic valve mean gradient measures 5.0 mmHg. Aortic valve peak gradient measures 9.5  mmHg. Aortic valve area, by VTI measures 2.55 cm. Pulmonic Valve: The pulmonic valve was normal in structure. Pulmonic valve regurgitation is not visualized. Aorta: The ascending aorta was not well visualized. IAS/Shunts: No atrial level shunt detected by color flow Doppler. Additional Comments: TDS.  LEFT VENTRICLE PLAX 2D LVIDd:         3.30 cm   Diastology LVIDs:         2.10 cm   LV e' medial:    5.66 cm/s LV PW:         1.50 cm   LV E/e' medial:  17.7 LV IVS:        1.30 cm   LV e' lateral:   7.07 cm/s LVOT diam:     2.00 cm   LV E/e' lateral: 14.1 LV SV:         80 LV SV Index:   39 LVOT Area:     3.14 cm  RIGHT VENTRICLE RV Basal diam:  3.70 cm RV Mid diam:    2.50 cm RV S prime:     17.00 cm/s TAPSE (M-mode): 1.8 cm LEFT ATRIUM             Index        RIGHT ATRIUM           Index LA diam:        2.70 cm 1.32 cm/m   RA Area:     10.60 cm LA Vol (A2C):   42.0 ml 20.51 ml/m  RA Volume:   20.50 ml  10.01 ml/m LA Vol (A4C):   17.7 ml 8.64 ml/m LA Biplane Vol: 28.0 ml 13.67 ml/m  AORTIC VALVE AV Area (Vmax):    2.45 cm AV Area (Vmean):   2.25 cm AV Area (VTI):     2.55 cm AV Vmax:           154.00 cm/s AV Vmean:          107.000 cm/s AV VTI:            0.315 m AV Peak Grad:      9.5 mmHg AV Mean Grad:      5.0 mmHg LVOT Vmax:         120.00 cm/s LVOT Vmean:        76.700 cm/s LVOT VTI:          0.256 m LVOT/AV VTI ratio: 0.81  AORTA Ao Root diam: 2.40 cm MITRAL VALVE                TRICUSPID VALVE MV Area (PHT): 2.47 cm     TR Peak grad:   38.4 mmHg MV Decel Time: 307 msec     TR Vmax:        310.00 cm/s MV E velocity: 100.00 cm/s MV A velocity: 138.00 cm/s  SHUNTS MV E/A ratio:  0.72         Systemic VTI:  0.26 m  Systemic Diam: 2.00 cm Yolonda Kida MD Electronically signed by Yolonda Kida MD Signature Date/Time: 03/18/2022/8:21:07 AM    Final    [place] EKG SITE RITE  Result Date: 03/17/2022 If Site Rite image not attached, placement could not be confirmed due to  current cardiac rhythm.  DG Chest Port 1 View  Result Date: 03/17/2022 CLINICAL DATA:  Shortness of breath EXAM: PORTABLE CHEST 1 VIEW COMPARISON:  Chest x-ray dated March 15, 2022 FINDINGS: Cardiac and mediastinal contours unchanged. Diffuse bilateral heterogeneous opacities, not significantly changed when compared with prior exam. No large pleural effusion or evidence of pneumothorax. IMPRESSION: Diffuse bilateral heterogeneous opacities, not significantly changed when compared with prior exam, differential considerations include multifocal infection or inflammatory etiology such as drug reaction. Electronically Signed   By: Yetta Glassman M.D.   On: 03/17/2022 09:37   DG Chest Port 1 View  Result Date: 03/15/2022 CLINICAL DATA:  Progressive shortness of breath. EXAM: PORTABLE CHEST 1 VIEW COMPARISON:  03/14/2022 FINDINGS: Heart size and mediastinal contours are unremarkable. Aortic atherosclerotic calcifications. Lung volumes are low. Progressive, bilateral upper and lower lobe interstitial and airspace opacities. IMPRESSION: Progressive, bilateral upper and lower lobe interstitial and airspace opacities compatible with multifocal pneumonia. Electronically Signed   By: Kerby Moors M.D.   On: 03/15/2022 17:32   CT Angio Chest PE W and/or Wo Contrast  Result Date: 04/06/2022 CLINICAL DATA:  Cough for 2 weeks, hypoxia, history of stage IV lung and breast cancer EXAM: CT ANGIOGRAPHY CHEST WITH CONTRAST TECHNIQUE: Multidetector CT imaging of the chest was performed using the standard protocol during bolus administration of intravenous contrast. Multiplanar CT image reconstructions and MIPs were obtained to evaluate the vascular anatomy. RADIATION DOSE REDUCTION: This exam was performed according to the departmental dose-optimization program which includes automated exposure control, adjustment of the mA and/or kV according to patient size and/or use of iterative reconstruction technique. CONTRAST:  63m  OMNIPAQUE IOHEXOL 350 MG/ML SOLN COMPARISON:  03/11/2022 FINDINGS: Cardiovascular: This is a technically adequate evaluation of the pulmonary vasculature. No filling defects or pulmonary emboli. The heart is unremarkable without pericardial effusion. No evidence of thoracic aortic aneurysm or dissection. Atherosclerosis of the aorta and coronary vasculature. Mediastinum/Nodes: No enlarged mediastinal, hilar, or axillary lymph nodes. Thyroid gland, trachea, and esophagus demonstrate no significant findings. Small hiatal hernia. Lungs/Pleura: There is diffuse increased interstitial prominence, with multifocal bilateral airspace disease as seen on preceding chest x-ray. I would favor multifocal bilateral pneumonia such as COVID-19 over metastatic disease. No effusion or pneumothorax. Central airways are patent. Upper Abdomen: Nodular thickening of the left adrenal gland measuring up to 18 mm, suspicious for metastatic disease in a patient with a history of lung and breast cancer. No other acute upper abdominal findings. Musculoskeletal: No acute or destructive bony lesions. Reconstructed images demonstrate no additional findings. Review of the MIP images confirms the above findings. IMPRESSION: 1. No evidence of pulmonary embolus. 2. Multifocal bilateral airspace disease, favor pneumonia over metastatic disease. Atypical viral etiologies such as COVID-19 should be considered based on pattern of findings. 3. Nodular thickening left adrenal gland, concerning for metastatic disease in a patient with a history of breast and lung cancer. 4.  Aortic Atherosclerosis (ICD10-I70.0). Electronically Signed   By: MRanda NgoM.D.   On: 03/10/2022 23:24   DG Chest 2 View  Result Date: 03/15/2022 CLINICAL DATA:  Cough. Decreased oxygen saturation. History of stage IV lung and breast cancer. EXAM: CHEST - 2 VIEW COMPARISON:  None Available.  FINDINGS: Heart size and mediastinal contours are unremarkable. Aortic atherosclerotic  calcifications. Bilateral upper and lower lobe airspace opacities with diffuse increase interstitial markings identified. No signs of pleural effusion. Visualized osseous structures appear intact. IMPRESSION: Bilateral upper and lower lobe airspace opacities with diffuse increase interstitial markings. In the acute setting, imaging findings are concerning for multifocal pneumonia with superimposed interstitial edema. However, in the setting of stage IV lung cancer and breast cancer findings are less specific and may also be seen with diffuse pulmonary metastasis. Electronically Signed   By: Kerby Moors M.D.   On: 03/19/2022 14:55    PERFORMANCE STATUS (ECOG) : 4 - Bedbound  Review of Systems Unable to complete  Physical Exam General: Critically ill-appearing Pulmonary: On vent Extremities: no edema, no joint deformities Skin: no rashes Neurological: Sedated  IMPRESSION: Patient's respiratory status further decompensated and she was intubated today.  Patient is currently sedated on vent.  No family at bedside.  Case discussed with Dr. Mortimer Fries. Agree with DNR. Unfortunately, prognosis is poor. Will speak with patient's family to clarify goals.   PLAN: -Continue current scope of treatment -Agree with DNR   Time Total: 15 minutes  Visit consisted of counseling and education dealing with the complex and emotionally intense issues of symptom management and palliative care in the setting of serious and potentially life-threatening illness.Greater than 50%  of this time was spent counseling and coordinating care related to the above assessment and plan.  Signed by: Altha Harm, PhD, NP-C

## 2022-03-18 NOTE — IPAL (Signed)
  Interdisciplinary Goals of Care Family Meeting   Date carried out: 03/18/2022  Location of the meeting: Bedside  Member's involved: Physician, Bedside Registered Nurse, and Family Member or next of kin   GOALS OF CARE DISCUSSION  The Clinical status was relayed to family in detail- Son and Husband at Bedside  Updated and notified of patients medical condition- Patient remains unresponsive and will not open eyes to command.   Patient is having a weak cough and struggling to remove secretions.   Patient with increased WOB and using accessory muscles to breathe Explained to family course of therapy and the modalities   Patient with Progressive multiorgan failure with a very high probablity of a very minimal chance of meaningful recovery despite all aggressive and optimal medical therapy.   Progressive hypoxia, Interstitial lung disease pneumonia, edema pneumonitis CT scans reviewed, Oncology History reviewed, End stage BREAST CANCER  Family understands the situation.  They have consented and agreed to DNR  Family are satisfied with Plan of action and management. All questions answered  Additional CC time 45 mins   Steffie Waggoner Patricia Pesa, M.D.  [facility]  Medical Director [place] Director [facility] Cardio-Pulmonary Department

## 2022-03-18 NOTE — Progress Notes (Signed)
Pt noted to be in progressive respiratory distress, labored breathing at rest, MD and NP at bedside have assessed and spoke to pt who agrees to intubation, pt intubated by NP and tolerated well, family at bedside have been updated by Dr Mortimer Fries. Pt tolerating vent well, sedated as charted.

## 2022-03-18 NOTE — Consult Note (Signed)
NAME:  PAYSLIE MCCAIG, MRN:  [id number], DOB:  1945/05/17, LOS: 5 ADMISSION DATE:  03/26/2022   BRIEF SYNOPSIS RESP DISTRESS  History of Present Illness:  77 y.o. 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 and a former smoker who presents to the ED from urgent care for evaluation of hypoxia/SOB.     She presented there with a 2-week history of a dry cough and shortness of breath and generalized malaise and was found to be hypoxic to the 60s.  She was placed on O2 at 3 L with improvement to the 90s and sent to the ED.    ED course and data review: afebrile with pulse in the 70s to 80s, respirations 22, BP 112/49 and O2 sat initially 87% on 3 L improving to high 90s on 4 L.   wBC 8200 with lactic acid 1.4.   Respiratory viral panel negative for COVID flu and RSV.   Troponin 21 and BNP 27.   BMP significant for creatinine of 1.46 above baseline of 0.98, with bicarb of 18 and sodium 128.    CTA chest negative for PE and showing multifocal pneumonia versus metastatic disease.  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   Patient was started on Levaquin due to history of severe penicillin allergy and given an IV fluid bolus of NS   1/8 PCCM CONSULTED FOR SEVERE HYPOXIA AND WOB PLACED ON BIPAP  ONCOLOGY  Medical History   Oncology History Overview Note Denovo metastatic disease ER 99% PR 99% HER1 1+ FISH neg, to mediastinal LAD, stage IV Diagnosis: Screening mammo - abnormal R breast area 06/23/21 07/17/21 - diagnostic mammo - 3.1cm mass in R upper outer, abnormal R axillary LN 08/12/21 - Bx - c/w IDC, grade 2, LN bx + 09/24/21 - CT CAP concerning for hilar LAD and 2 pulmonary nodules c/f metastatic disease, bone scan negative 10/01/21 - bronchoscopy bx of mediastinal LN - metastatic adenocarcinoma c/w breast 10/17/21 - estradiol PET - ER+ disease in R breast  tissue, pulmonary and bone lesions, R axillary, mediastinal and bilateral hilar adenopathy 11/06/21 - started letrozole, plan for abemaciclib start '50mg'$  BID  Significant Hospital Events: Including procedures, antibiotic start and stop dates in addition to other pertinent events   ADMITTED 1/5 FOR HYPOXIA AND SOB 1/8 PCCM CONSULTED FOR SEVERE HYPOXIA 1/9 severe hypoxia fio2 at 93% 1/10 fio2 at 83% severe SOB and hypoxia      Micro Data:  COVID/FLU/RSV NEG   Antimicrobials:   Antibiotics Given (last 72 hours)     Date/Time Action Medication Dose Rate   03/15/22 1639 New Bag/Given   levofloxacin (LEVAQUIN) IVPB 750 mg 750 mg 100 mL/hr   03/16/22 1950 New Bag/Given   levofloxacin (LEVAQUIN) IVPB 750 mg 750 mg 100 mL/hr   03/17/22 1624 Given   sulfamethoxazole-trimethoprim (BACTRIM DS) 800-160 MG per tablet 2 tablet 2 tablet    03/17/22 1759 New Bag/Given   levofloxacin (LEVAQUIN) IVPB 750 mg 750 mg 100 mL/hr   03/17/22 2154 Given   sulfamethoxazole-trimethoprim (BACTRIM DS) 800-160 MG per tablet 2 tablet 2 tablet    03/18/22 0600 Given   sulfamethoxazole-trimethoprim (BACTRIM DS) 800-160 MG per tablet 2 tablet 2 tablet         EVENTS OVERNIGHT FiO2 (%):  [60 %-98 %] 80 % SEVERE HYPOXIA High risk for intubation Patient refused PICC line last night confused     Latest  Ref Rng & Units 03/17/2022    5:39 AM 03/16/2022    6:13 AM 03/15/2022    4:11 AM  BMP  Glucose 70 - 99 mg/dL 131  113  107   BUN 8 - 23 mg/dL '18  9  8   '$ Creatinine 0.44 - 1.00 mg/dL 0.92  0.90  1.00   Sodium 135 - 145 mmol/L 137  136  136   Potassium 3.5 - 5.1 mmol/L 3.2  3.8  3.1   Chloride 98 - 111 mmol/L 101  103  105   CO2 22 - 32 mmol/L '27  24  24   '$ Calcium 8.9 - 10.3 mg/dL 8.9  8.8  8.3       Objective   Blood pressure 122/65, pulse 90, temperature 97.9 F (36.6 C), temperature source Oral, resp. rate (!) 29, height '5\' 5"'$  (1.651 m), weight 98.4 kg, SpO2 94 %.    FiO2 (%):  [60 %-98 %] 80 %    Intake/Output Summary (Last 24 hours) at 03/18/2022 0734 Last data filed at 03/18/2022 0601 Gross per 24 hour  Intake 953.53 ml  Output 2450 ml  Net -1496.47 ml    Filed Weights   03/14/22 0053  Weight: 98.4 kg      Review of Systems: +SOB +WOB Other:  All other systems negative    Physical Examination:   General Appearance: + distress  EYES PERRLA, EOM intact.   NECK Supple, No JVD Pulmonary: normal breath sounds, No wheezing.  CardiovascularNormal S1,S2.  No m/r/g.   Abdomen: Benign, Soft, non-tender. +confusion ALL OTHER ROS ARE NEGATIVE   Labs/imaging that I havepersonally reviewed  (right click and "Reselect all SmartList Selections" daily)       ASSESSMENT AND PLAN SYNOPSIS  76 YO WHITE FEMALE WITH METASTATIC BREAST CANCER ON IMMUNOTHERAPY WITH PROGRESSIVE HYPOXIA AND RESP FAILURE, ETIOLOGIES INCLUDE DRUG INDUCED TOXICITY FROM IMMUNOTHERAPY, METASTATIC BEAST CANCER, INFECTIOUS CAUSES(ATYPICAL, PCP) PULM EDEMA   Severe ACUTE Hypoxic and Hypercapnic Respiratory Failure weaned to HFNC HIGH RISK FOR INTUBATION,CARDIAC ARREST CONSIDER LASIX THERAPY as BP tolerates Continue IV STEROIDS NEBS FiO2 (%):  [60 %-98 %] 80 %   CARDIAC ICU monitoring  RENAL -continue Foley Catheter-assess need -Avoid nephrotoxic agents -Follow urine output, BMP -Ensure adequate renal perfusion, optimize oxygenation -Renal dose medications   Intake/Output Summary (Last 24 hours) at 03/18/2022 0737 Last data filed at 03/18/2022 0601 Gross per 24 hour  Intake 953.53 ml  Output 2450 ml  Net -1496.47 ml    INFECTIOUS DISEASE -continue antibiotics as prescribed -follow up cultures  NUTRITIONAL STATUS DIET-->as tolerated Constipation protocol as indicated    ELECTROLYTES -follow labs as needed -replace as needed -pharmacy consultation and following  METASTATIC BREAST CANCER CONSULT ONCOLOGY  PATIENT REFUSED PICC LINE  Best practice (right click and  "Reselect all SmartList Selections" daily)  Diet: NPO Code Status:  FULL Disposition:ICU  Labs   CBC: Recent Labs  Lab 03/14/22 0328 03/15/22 0411 03/16/22 0613 03/17/22 0539 03/18/22 0605  WBC 7.5 7.9 9.3 8.3 9.4  HGB 10.2* 9.9* 10.5* 10.5* 11.0*  HCT 31.6* 29.4* 32.3* 31.2* 33.1*  MCV 94.0 91.6 92.0 91.5 91.2  PLT 268 282 245 188 191     Basic Metabolic Panel: Recent Labs  Lab 03/29/2022 1424 03/14/22 0328 03/15/22 0411 03/16/22 0613 03/17/22 0539 03/18/22 0605  NA 128* 135 136 136 137  --   K 3.5 3.2* 3.1* 3.8 3.2*  --   CL 98 102 105 103 101  --  CO2 18* '22 24 24 27  '$ --   GLUCOSE 133* 118* 107* 113* 131*  --   BUN '12 14 8 9 18  '$ --   CREATININE 1.46* 1.58* 1.00 0.90 0.92  --   CALCIUM 8.6* 8.3* 8.3* 8.8* 8.9  --   MG  --   --   --   --  2.1 2.3  PHOS  --   --   --   --  2.9  --     GFR: Estimated Creatinine Clearance: 61.4 mL/min (by C-G formula based on SCr of 0.92 mg/dL). Recent Labs  Lab 03/11/2022 1424 03/21/2022 2239 03/14/22 0328 03/15/22 0411 03/16/22 1610 03/16/22 1213 03/17/22 0539 03/18/22 0605  PROCALCITON 0.22  --   --   --   --  0.43 0.50  --   WBC 8.2  --    < > 7.9 9.3  --  8.3 9.4  LATICACIDVEN  --  1.4  --   --   --   --   --   --    < > = values in this interval not displayed.     Liver Function Tests: Recent Labs  Lab 03/15/22 0411 03/16/22 0613 03/17/22 0539  AST 34 24  --   ALT 40 32  --   ALKPHOS 92 121  --   BILITOT 0.7 0.8  --   PROT 5.8* 6.0*  --   ALBUMIN 2.3* 2.3* 2.3*    No results for input(s): "LIPASE", "AMYLASE" in the last 168 hours. No results for input(s): "AMMONIA" in the last 168 hours.  ABG    Component Value Date/Time   PHART 7.47 (H) 03/15/2022 1635   PCO2ART 34 03/15/2022 1635   PO2ART 52 (L) 03/15/2022 1635   HCO3 24.2 03/16/2022 1223   O2SAT 94.2 03/16/2022 1223      DVT/GI PRX  assessed I Assessed the need for Labs I Assessed the need for Foley I Assessed the need for Central Venous  Line Family Discussion when available I Assessed the need for Mobilization I made an Assessment of medications to be adjusted accordingly Safety Risk assessment completed  CASE DISCUSSED IN MULTIDISCIPLINARY ROUNDS WITH ICU TEAM     Critical Care Time devoted to patient care services described in this note is 55 minutes.  Critical care was necessary to treat /prevent imminent and life-threatening deterioration.    Corrin Parker, M.D.  [facility]  Medical Director [place] Director [facility] Cardio-Pulmonary Department

## 2022-03-18 NOTE — Procedures (Signed)
Endotracheal Intubation: Patient required placement of an artificial airway secondary to acute hypoxic respiratory failure    Consent: Emergent.    Hand washing performed prior to starting the procedure.    Medications administered for sedation prior to procedure:  Fentanyl 100 mcg IV, 20 mg Etomidate IV, Rocuronium 100 mg IV     A time out procedure was called and correct patient, name, & ID confirmed. Needed supplies and equipment were assembled and checked to include ETT, 10 ml syringe, Glidescope, Mac and Miller blades, suction, oxygen and bag mask valve, end tidal CO2 monitor.    Patient was positioned to align the mouth and pharynx to facilitate visualization of the glottis.    Heart rate, SpO2 and blood pressure was continuously monitored during the procedure. Pre-oxygenation was conducted prior to intubation and endotracheal tube was placed through the vocal cords into the trachea.       The artificial airway was placed under direct visualization via glidescope route using a 8 cm ETT on the first attempt.   ETT was secured at 23 cm .   Placement was confirmed by auscuitation of lungs with good breath sounds bilaterally and no stomach sounds.  Condensation was noted on endotracheal tube.   Pulse ox 94% CO2 detector in place with appropriate color change.    Complications: None .        Chest radiograph ordered and pending.   Donell Beers, [place] Pager [phone] (please enter 7 digits) PCCM Consult Pager [phone] (please enter 7 digits)

## 2022-03-19 ENCOUNTER — Other Ambulatory Visit: Payer: Self-pay

## 2022-03-19 DIAGNOSIS — J9601 Acute respiratory failure with hypoxia: Secondary | ICD-10-CM | POA: Diagnosis not present

## 2022-03-19 DIAGNOSIS — C50911 Malignant neoplasm of unspecified site of right female breast: Secondary | ICD-10-CM | POA: Diagnosis not present

## 2022-03-19 DIAGNOSIS — E872 Acidosis, unspecified: Secondary | ICD-10-CM | POA: Diagnosis not present

## 2022-03-19 DIAGNOSIS — N179 Acute kidney failure, unspecified: Secondary | ICD-10-CM | POA: Diagnosis not present

## 2022-03-19 DIAGNOSIS — Z515 Encounter for palliative care: Secondary | ICD-10-CM | POA: Diagnosis not present

## 2022-03-19 LAB — RENAL FUNCTION PANEL
Albumin: 2.3 g/dL — ABNORMAL LOW (ref 3.5–5.0)
Anion gap: 9 (ref 5–15)
BUN: 25 mg/dL — ABNORMAL HIGH (ref 8–23)
CO2: 29 mmol/L (ref 22–32)
Calcium: 8.7 mg/dL — ABNORMAL LOW (ref 8.9–10.3)
Chloride: 98 mmol/L (ref 98–111)
Creatinine, Ser: 0.97 mg/dL (ref 0.44–1.00)
GFR, Estimated: >60 mL/min (ref >60)
Glucose, Bld: 182 mg/dL — ABNORMAL HIGH (ref 70–99)
Phosphorus: 2.7 mg/dL (ref 2.5–4.6)
Potassium: 3.7 mmol/L (ref 3.5–5.1)
Sodium: 136 mmol/L (ref 135–145)

## 2022-03-19 LAB — CBC
HCT: 32.4 % — ABNORMAL LOW (ref 36.0–46.0)
Hemoglobin: 10.4 g/dL — ABNORMAL LOW (ref 12.0–15.0)
MCH: 30.2 pg (ref 26.0–34.0)
MCHC: 32.1 g/dL (ref 30.0–36.0)
MCV: 94.2 fL (ref 80.0–100.0)
Platelets: 188 10*3/uL (ref 150–400)
RBC: 3.44 MIL/uL — ABNORMAL LOW (ref 3.87–5.11)
RDW: 15.8 % — ABNORMAL HIGH (ref 11.5–15.5)
WBC: 11.3 10*3/uL — ABNORMAL HIGH (ref 4.0–10.5)
nRBC: 0 % (ref 0.0–0.2)

## 2022-03-19 LAB — MAGNESIUM: Magnesium: 2.3 mg/dL (ref 1.7–2.4)

## 2022-03-19 LAB — GLUCOSE, CAPILLARY
Glucose-Capillary: 165 mg/dL — ABNORMAL HIGH (ref 70–99)
Glucose-Capillary: 185 mg/dL — ABNORMAL HIGH (ref 70–99)
Glucose-Capillary: 169 mg/dL — ABNORMAL HIGH (ref 70–99)
Glucose-Capillary: 228 mg/dL — ABNORMAL HIGH (ref 70–99)
Glucose-Capillary: 215 mg/dL — ABNORMAL HIGH (ref 70–99)
Glucose-Capillary: 217 mg/dL — ABNORMAL HIGH (ref 70–99)

## 2022-03-19 LAB — LACTIC ACID, PLASMA: Lactic Acid, Venous: 1.6 mmol/L (ref 0.5–1.9)

## 2022-03-19 LAB — HEMOGLOBIN A1C
Hgb A1c MFr Bld: 6 % — ABNORMAL HIGH (ref 4.8–5.6)
Mean Plasma Glucose: 125.5 mg/dL

## 2022-03-19 LAB — TRIGLYCERIDES: Triglycerides: 160 mg/dL — ABNORMAL HIGH (ref <150)

## 2022-03-19 MED ORDER — VANCOMYCIN HCL 1250 MG/250ML IV SOLN
1250.0000 mg | INTRAVENOUS | Status: DC
  Administered 2022-03-19: 1250 mg via INTRAVENOUS
  Filled 2022-03-19: qty 250

## 2022-03-19 MED ORDER — VECURONIUM BROMIDE 10 MG IV SOLR
INTRAVENOUS | Status: AC
  Filled 2022-03-19: qty 10

## 2022-03-19 MED ORDER — FREE WATER
100.0000 mL | Freq: Four times a day (QID) | Status: DC
  Administered 2022-03-19 – 2022-03-22 (×11): 100 mL

## 2022-03-19 MED ORDER — VANCOMYCIN HCL 2000 MG/400ML IV SOLN
2000.0000 mg | Freq: Once | INTRAVENOUS | Status: AC
  Administered 2022-03-19: 2000 mg via INTRAVENOUS
  Filled 2022-03-19: qty 400

## 2022-03-19 MED ORDER — SODIUM CHLORIDE 0.9 % IV SOLN
1.0000 g | Freq: Three times a day (TID) | INTRAVENOUS | Status: DC
  Administered 2022-03-19 – 2022-03-21 (×6): 1 g via INTRAVENOUS
  Filled 2022-03-19: qty 1
  Filled 2022-03-19 (×4): qty 20
  Filled 2022-03-19: qty 1
  Filled 2022-03-19: qty 20

## 2022-03-19 MED ORDER — LORAZEPAM 2 MG/ML IJ SOLN
INTRAMUSCULAR | Status: AC
  Filled 2022-03-19: qty 2

## 2022-03-19 MED ORDER — INSULIN ASPART 100 UNIT/ML IJ SOLN
0.0000 [IU] | INTRAMUSCULAR | Status: DC
  Administered 2022-03-19 (×2): 7 [IU] via SUBCUTANEOUS
  Administered 2022-03-20: 4 [IU] via SUBCUTANEOUS
  Administered 2022-03-20: 3 [IU] via SUBCUTANEOUS
  Administered 2022-03-20: 11 [IU] via SUBCUTANEOUS
  Administered 2022-03-20 – 2022-03-21 (×6): 4 [IU] via SUBCUTANEOUS
  Administered 2022-03-21: 7 [IU] via SUBCUTANEOUS
  Administered 2022-03-21: 4 [IU] via SUBCUTANEOUS
  Administered 2022-03-22: 3 [IU] via SUBCUTANEOUS
  Administered 2022-03-22: 4 [IU] via SUBCUTANEOUS
  Filled 2022-03-19 (×15): qty 1

## 2022-03-19 MED ORDER — ROSUVASTATIN CALCIUM 10 MG PO TABS
40.0000 mg | ORAL_TABLET | Freq: Every day | ORAL | Status: DC
  Administered 2022-03-20 – 2022-03-21 (×2): 40 mg
  Filled 2022-03-19 (×2): qty 4

## 2022-03-19 MED ORDER — LACTATED RINGERS IV BOLUS
1000.0000 mL | Freq: Once | INTRAVENOUS | Status: AC
  Administered 2022-03-19: 1000 mL via INTRAVENOUS

## 2022-03-19 MED ORDER — METHYLPREDNISOLONE SODIUM SUCC 125 MG IJ SOLR
80.0000 mg | Freq: Two times a day (BID) | INTRAMUSCULAR | Status: DC
  Administered 2022-03-19 – 2022-03-21 (×6): 80 mg via INTRAVENOUS
  Filled 2022-03-19 (×6): qty 2

## 2022-03-19 NOTE — Progress Notes (Signed)
NAME:  Erica Waters, MRN:  [id number], DOB:  06-Jul-1945, LOS: 6 ADMISSION DATE:  04/04/2022   BRIEF SYNOPSIS RESP DISTRESS  History of Present Illness:  77 y.o. 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 and a former smoker who presents to the ED from urgent care for evaluation of hypoxia/SOB.     She presented there with a 2-week history of a dry cough and shortness of breath and generalized malaise and was found to be hypoxic to the 60s.  She was placed on O2 at 3 L with improvement to the 90s and sent to the ED.    ED course and data review: afebrile with pulse in the 70s to 80s, respirations 22, BP 112/49 and O2 sat initially 87% on 3 L improving to high 90s on 4 L.   wBC 8200 with lactic acid 1.4.   Respiratory viral panel negative for COVID flu and RSV.   Troponin 21 and BNP 27.   BMP significant for creatinine of 1.46 above baseline of 0.98, with bicarb of 18 and sodium 128.    CTA chest negative for PE and showing multifocal pneumonia versus metastatic disease.  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   Patient was started on Levaquin due to history of severe penicillin allergy and given an IV fluid bolus of NS   1/8 PCCM CONSULTED FOR SEVERE HYPOXIA AND WOB PLACED ON BIPAP  ONCOLOGY  Medical History   Oncology History Overview Note Denovo metastatic disease ER 99% PR 99% HER1 1+ FISH neg, to mediastinal LAD, stage IV Diagnosis: Screening mammo - abnormal R breast area 06/23/21 07/17/21 - diagnostic mammo - 3.1cm mass in R upper outer, abnormal R axillary LN 08/12/21 - Bx - c/w IDC, grade 2, LN bx + 09/24/21 - CT CAP concerning for hilar LAD and 2 pulmonary nodules c/f metastatic disease, bone scan negative 10/01/21 - bronchoscopy bx of mediastinal LN - metastatic adenocarcinoma c/w breast 10/17/21 - estradiol PET - ER+ disease in R breast  tissue, pulmonary and bone lesions, R axillary, mediastinal and bilateral hilar adenopathy 11/06/21 - started letrozole, plan for abemaciclib start '50mg'$  BID  Significant Hospital Events: Including procedures, antibiotic start and stop dates in addition to other pertinent events   ADMITTED 1/5 FOR HYPOXIA AND SOB 1/8 PCCM CONSULTED FOR SEVERE HYPOXIA 1/9 severe hypoxia fio2 at 93% 1/10 fio2 at 83% severe SOB and hypoxia, emergently intubated      Micro Data:  COVID/FLU/RSV NEG   Antimicrobials:   Antibiotics Given (last 72 hours)     Date/Time Action Medication Dose Rate   03/16/22 1950 New Bag/Given   levofloxacin (LEVAQUIN) IVPB 750 mg 750 mg 100 mL/hr   03/17/22 1624 Given   sulfamethoxazole-trimethoprim (BACTRIM DS) 800-160 MG per tablet 2 tablet 2 tablet    03/17/22 1759 New Bag/Given   levofloxacin (LEVAQUIN) IVPB 750 mg 750 mg 100 mL/hr   03/17/22 2154 Given   sulfamethoxazole-trimethoprim (BACTRIM DS) 800-160 MG per tablet 2 tablet 2 tablet    03/18/22 0600 Given   sulfamethoxazole-trimethoprim (BACTRIM DS) 800-160 MG per tablet 2 tablet 2 tablet    03/18/22 1415 Given   sulfamethoxazole-trimethoprim (BACTRIM DS) 800-160 MG per tablet 2 tablet 2 tablet    03/18/22 1705 New Bag/Given   levofloxacin (LEVAQUIN) IVPB 750 mg 750 mg 100 mL/hr   03/19/22 0000 Given   sulfamethoxazole-trimethoprim (BACTRIM DS) 800-160 MG per tablet  2 tablet 2 tablet         EVENTS OVERNIGHT Vent Mode: PRVC FiO2 (%):  [60 %-87 %] 70 % Set Rate:  [18 bmp] 18 bmp Vt Set:  [450 mL] 450 mL PEEP:  [5 cmH20] 5 cmH20 Plateau Pressure:  [26 cmH20] 26 cmH20 SEVERE HYPOXIA ALI/ARDS UNABLE TO WEAN FROM VENT PLAN  FOR BRONCH TODAY       Latest Ref Rng & Units 03/19/2022    4:27 AM 03/18/2022    6:05 AM 03/17/2022    5:39 AM  BMP  Glucose 70 - 99 mg/dL 182  136  131   BUN 8 - 23 mg/dL '25  19  18   '$ Creatinine 0.44 - 1.00 mg/dL 0.97  0.85  0.92   Sodium 135 - 145 mmol/L 136  137  137    Potassium 3.5 - 5.1 mmol/L 3.7  3.9  3.2   Chloride 98 - 111 mmol/L 98  101  101   CO2 22 - 32 mmol/L '29  25  27   '$ Calcium 8.9 - 10.3 mg/dL 8.7  8.9  8.9       Objective   Blood pressure (!) 106/52, pulse 90, temperature 99.1 F (37.3 C), temperature source Axillary, resp. rate 13, height '5\' 5"'$  (1.651 m), weight 93.7 kg, SpO2 95 %.    Vent Mode: PRVC FiO2 (%):  [60 %-87 %] 70 % Set Rate:  [18 bmp] 18 bmp Vt Set:  [450 mL] 450 mL PEEP:  [5 cmH20] 5 cmH20 Plateau Pressure:  [26 cmH20] 26 cmH20   Intake/Output Summary (Last 24 hours) at 03/19/2022 0723 Last data filed at 03/19/2022 0700 Gross per 24 hour  Intake 1601.36 ml  Output 1200 ml  Net 401.36 ml    Filed Weights   03/14/22 0053 03/19/22 0424  Weight: 98.4 kg 93.7 kg      REVIEW OF SYSTEMS  PATIENT IS UNABLE TO PROVIDE COMPLETE REVIEW OF SYSTEMS DUE TO SEVERE CRITICAL ILLNESS   PHYSICAL EXAMINATION:  GENERAL:critically ill appearing, +resp distress EYES: Pupils equal, round, reactive to light.  No scleral icterus.  MOUTH: Moist mucosal membrane. INTUBATED NECK: Supple.  PULMONARY: Lungs clear to auscultation, +rhonchi, +wheezing CARDIOVASCULAR: S1 and S2.  Regular rate and rhythm GASTROINTESTINAL: Soft, nontender, -distended. Positive bowel sounds.  MUSCULOSKELETAL: No swelling, clubbing, or edema.  NEUROLOGIC: obtunded,sedated SKIN:normal, warm to touch, Capillary refill delayed  Pulses present bilaterally  Labs/imaging that I havepersonally reviewed  (right click and "Reselect all SmartList Selections" daily)       ASSESSMENT AND PLAN SYNOPSIS  76 YO WHITE FEMALE WITH METASTATIC BREAST CANCER ON IMMUNOTHERAPY WITH PROGRESSIVE HYPOXIA AND RESP FAILURE, ETIOLOGIES INCLUDE DRUG INDUCED TOXICITY FROM IMMUNOTHERAPY, METASTATIC BEAST CANCER, INFECTIOUS CAUSES(ATYPICAL, PCP) PULM EDEMA LEADING TO SEVERE RESP FAILURE REQUIRING MV SUPPORT  Severe ACUTE Hypoxic and Hypercapnic Respiratory Failure -continue  Mechanical Ventilator support -Wean Fio2 and PEEP as tolerated -VAP/VENT bundle implementation - Wean PEEP & FiO2 as tolerated, maintain SpO2 > 88% - Head of bed elevated 30 degrees, VAP protocol in place - Plateau pressures less than 30 cm H20  - Intermittent chest x-ray & ABG PRN - Ensure adequate pulmonary hygiene  UNable to perform SAT/SBT   CONSIDER LASIX THERAPY as BP tolerates Continue IV STEROIDS NEBS Vent Mode: PRVC FiO2 (%):  [60 %-87 %] 70 % Set Rate:  [18 bmp] 18 bmp Vt Set:  [450 mL] 450 mL PEEP:  [5 cmH20] 5 cmH20 Plateau Pressure:  [26 cmH20] 26  cmH20   CARDIAC ICU monitoring   RENAL -continue Foley Catheter-assess need -Avoid nephrotoxic agents -Follow urine output, BMP -Ensure adequate renal perfusion, optimize oxygenation -Renal dose medications   Intake/Output Summary (Last 24 hours) at 03/19/2022 0725 Last data filed at 03/19/2022 0700 Gross per 24 hour  Intake 1601.36 ml  Output 1200 ml  Net 401.36 ml      INFECTIOUS DISEASE -continue antibiotics as prescribed -follow up cultures PLAN FOR BRONCH TODAY  NUTRITIONAL STATUS DIET-->as tolerated Constipation protocol as indicated  ENDO - ICU hypoglycemic\Hyperglycemia protocol -check FSBS per protocol   GI GI PROPHYLAXIS as indicated  NUTRITIONAL STATUS DIET-->TF's as tolerated Constipation protocol as indicated   ELECTROLYTES -follow labs as needed -replace as needed -pharmacy consultation and following   Best practice (right click and "Reselect all SmartList Selections" daily)     Best practice     Diet:TF's Pain/Anxiety/Delirium protocol (if indicated): Yes (RASS goal -2) VAP protocol (if indicated): Yes DVT prophylaxis: LMWH GI prophylaxis: H2B Glucose control:  SSI Yes Central venous access:  PICC LINE PLACED 1/10 Arterial line:  N/A Foley:  Yes, and it is still needed Mobility:  bed rest  Code Status:  DNR Disposition:ICU    Labs   CBC: Recent Labs  Lab  03/15/22 0411 03/16/22 0613 03/17/22 0539 03/18/22 0605 03/19/22 0427  WBC 7.9 9.3 8.3 9.4 11.3*  HGB 9.9* 10.5* 10.5* 11.0* 10.4*  HCT 29.4* 32.3* 31.2* 33.1* 32.4*  MCV 91.6 92.0 91.5 91.2 94.2  PLT 282 245 188 191 188     Basic Metabolic Panel: Recent Labs  Lab 03/15/22 0411 03/16/22 0613 03/17/22 0539 03/18/22 0605 03/19/22 0427  NA 136 136 137 137 136  K 3.1* 3.8 3.2* 3.9 3.7  CL 105 103 101 101 98  CO2 '24 24 27 25 29  '$ GLUCOSE 107* 113* 131* 136* 182*  BUN '8 9 18 19 '$ 25*  CREATININE 1.00 0.90 0.92 0.85 0.97  CALCIUM 8.3* 8.8* 8.9 8.9 8.7*  MG  --   --  2.1 2.3 2.3  PHOS  --   --  2.9 2.8 2.7    GFR: Estimated Creatinine Clearance: 56.7 mL/min (by C-G formula based on SCr of 0.97 mg/dL). Recent Labs  Lab 03/18/2022 1424 04/05/2022 2239 03/14/22 0328 03/16/22 2119 03/16/22 1213 03/17/22 0539 03/18/22 0605 03/19/22 0427  PROCALCITON 0.22  --   --   --  0.43 0.50 0.30  --   WBC 8.2  --    < > 9.3  --  8.3 9.4 11.3*  LATICACIDVEN  --  1.4  --   --   --   --   --   --    < > = values in this interval not displayed.     Liver Function Tests: Recent Labs  Lab 03/15/22 0411 03/16/22 4174 03/17/22 0539 03/18/22 0605 03/19/22 0427  AST 34 24  --   --   --   ALT 40 32  --   --   --   ALKPHOS 92 121  --   --   --   BILITOT 0.7 0.8  --   --   --   PROT 5.8* 6.0*  --   --   --   ALBUMIN 2.3* 2.3* 2.3* 2.4* 2.3*    No results for input(s): "LIPASE", "AMYLASE" in the last 168 hours. No results for input(s): "AMMONIA" in the last 168 hours.  ABG    Component Value Date/Time   PHART 7.29 (L)  03/18/2022 1100   PCO2ART 61 (H) 03/18/2022 1100   PO2ART 87 03/18/2022 1100   HCO3 29.3 (H) 03/18/2022 1100   O2SAT 96.6 03/18/2022 1100      DVT/GI PRX  assessed I Assessed the need for Labs I Assessed the need for Foley I Assessed the need for Central Venous Line Family Discussion when available I Assessed the need for Mobilization I made an Assessment of  medications to be adjusted accordingly Safety Risk assessment completed  CASE DISCUSSED IN MULTIDISCIPLINARY ROUNDS WITH ICU TEAM     Critical Care Time devoted to patient care services described in this note is 55 minutes.  Critical care was necessary to treat /prevent imminent and life-threatening deterioration. Overall, patient is critically ill, prognosis is guarded.  Patient with Multiorgan failure and at high risk for cardiac arrest and death.    Corrin Parker, M.D.  [facility]  Medical Director [place] Director [facility] Cardio-Pulmonary Department

## 2022-03-19 NOTE — TOC Initial Note (Signed)
Transition of Care [facility]) - Initial/Assessment Note    Patient Details  Name: Erica Waters MRN: [id number] Date of Birth: March 15, 1945  [facility] CM/SW Contact:    Shelbie Hutching, RN Phone Number: 03/19/2022, 4:48 PM  Clinical Narrative:                 Patient admitted to the hospital with CAP.  Patient required intubation yesterday.  Patient remains on the vent in the ICU sedated.  TOC following.   Expected Discharge Plan:  (TBD) Barriers to Discharge: Continued Medical Work up   Patient Goals and CMS Choice Patient states their goals for this hospitalization and ongoing recovery are:: patient is unable to state          Expected Discharge Plan and Services   Discharge Planning Services: CM Consult   Living arrangements for the past 2 months: Single Family Home                                      Prior Living Arrangements/Services Living arrangements for the past 2 months: Single Family Home Lives with:: Spouse Patient language and need for interpreter reviewed:: Yes        Need for Family Participation in Patient Care: Yes (Comment) Care giver support system in place?: Yes (comment)   Criminal Activity/Legal Involvement Pertinent to Current Situation/Hospitalization: No - Comment as needed  Activities of Daily Living Home Assistive Devices/Equipment: None ADL Screening (condition at time of admission) Patient's cognitive ability adequate to safely complete daily activities?: No Is the patient deaf or have difficulty hearing?: No Does the patient have difficulty seeing, even when wearing glasses/contacts?: No Does the patient have difficulty concentrating, remembering, or making decisions?: Yes Patient able to express need for assistance with ADLs?: Yes Does the patient have difficulty dressing or bathing?: Yes Independently performs ADLs?: No Bathing: Needs assistance Toileting: Needs assistance In/Out Bed: Needs assistance Walks  in Home: Needs assistance Does the patient have difficulty walking or climbing stairs?: Yes Weakness of Legs: Both Weakness of Arms/Hands: Both  Permission Sought/Granted                  Emotional Assessment Appearance:: Appears stated age Attitude/Demeanor/Rapport: Intubated (Following Commands or Not Following Commands) Affect (typically observed): Unable to Assess   Alcohol / Substance Use: Not Applicable Psych Involvement: No (comment)  Admission diagnosis:  CAP (community acquired pneumonia) [J18.9] Acute respiratory failure with hypoxia ([place]) [J96.01] Pneumonia of both lungs due to infectious organism, unspecified part of lung [J18.9] Patient Active Problem List   Diagnosis Date Noted   Pneumonia of both lungs due to infectious organism 03/16/2022   Palliative care encounter 03/16/2022   AKI (acute kidney injury) ([place]) 44/81/8563   Metabolic acidosis 14/97/0263   Hyponatremia 03/19/2022   CAP (community acquired pneumonia) 03/15/2022   Acute respiratory failure with hypoxia ([place]) 03/25/2022   Breast cancer metastasized to axillary lymph node, right ([place]) 08/07/2021   Essential hypertension 09/13/2018   Atrial fibrillation, chronic ([place]) 12/24/2017   PCP:  Derinda Late, MD Pharmacy:   [facility] [address], [place] [address] [place] [address] Phone: [phone] Fax: [phone]     Social Determinants of Health (SDOH) Social History: SDOH Screenings   Depression (PHQ2-9): Low Risk  (07/12/2018)  Tobacco Use: Medium Risk (03/14/2022)   SDOH Interventions: Housing Interventions: Intervention Not Indicated  Readmission Risk Interventions     No data to display

## 2022-03-19 NOTE — Progress Notes (Signed)
RT assisted Dr. Mortimer Fries and Andi Hence RN with bedside bronchoscopy. Consent and time out were obtained and done prior to procedure starting. Patients Fio2 increased to 100% for procedure. Ambu scop LOT #: [id number] and GTIN#: [id number] used for procedure. No complications occurred with procedure. FIO2 decreased back to 70% post procedure when saturations returned to baseline.

## 2022-03-19 NOTE — Progress Notes (Signed)
[place] at [facility] [phone] [phone]   Name: Erica Waters Date: 03/19/2022 MRN: [id number]  DOB: August 15, 1945  Patient Care Team: Derinda Late, MD as PCP - General (Family Medicine)    REASON FOR CONSULTATION: Erica Waters is a 77 y.o. female with multiple medical problems including PVD, A-fib, stage IV ER/PR positive breast cancer on treatment at [facility] with Verzenio.  Patient was admitted to hospital 04/01/2022 with community-acquired pneumonia.  Hospitalization has been complicated by worsening respiratory failure requiring BiPAP.  Palliative care was consulted to address goals.   CODE STATUS: DNR  PAST MEDICAL HISTORY: Past Medical History:  Diagnosis Date   Dysrhythmia    afib   Stroke [facility])     PAST SURGICAL HISTORY:  Past Surgical History:  Procedure Laterality Date   BREAST BIOPSY Right 08/12/2021   [place] bx 10:00 coil marker, [place] bx 12:00 ribbon marker, [place] bx axilla LN, hydro marker, path pending   COLONOSCOPY N/A 03/27/2021   Procedure: COLONOSCOPY;  Surgeon: Annamaria Helling, DO;  Location: [facility];  Service: Gastroenterology;  Laterality: N/A;   ESOPHAGOGASTRODUODENOSCOPY N/A 03/27/2021   Procedure: ESOPHAGOGASTRODUODENOSCOPY (EGD);  Surgeon: Annamaria Helling, DO;  Location: [facility] ENDOSCOPY;  Service: Gastroenterology;  Laterality: N/A;   HERNIA REPAIR     umbilical    HEMATOLOGY/ONCOLOGY HISTORY:  Oncology History   No history exists.    ALLERGIES:  is allergic to penicillin g.  MEDICATIONS:  Current Facility-Administered Medications  Medication Dose Route Frequency Provider Last Rate Last Admin   0.9 %  sodium chloride infusion   Intravenous PRN Richarda Osmond, MD   Stopped at 03/18/22 1705   acetaminophen (TYLENOL) tablet 650 mg  650 mg Per Tube Q6H PRN Benita Gutter, RPH       Or   acetaminophen (TYLENOL) suppository 650 mg  650 mg Rectal Q6H  PRN Benita Gutter, RPH       albuterol (PROVENTIL) (2.5 MG/3ML) 0.083% nebulizer solution 2.5 mg  2.5 mg Nebulization Q4H PRN Judd Gaudier V, MD   2.5 mg at 03/16/22 1111   apixaban (ELIQUIS) tablet 5 mg  5 mg Per Tube BID Benita Gutter, RPH   5 mg at 03/18/22 2128   budesonide (PULMICORT) nebulizer solution 0.25 mg  0.25 mg Nebulization BID Richarda Osmond, MD   0.25 mg at 03/19/22 0720   Chlorhexidine Gluconate Cloth 2 % PADS 6 each  6 each Topical Daily Richarda Osmond, MD   6 each at 03/18/22 0941   docusate (COLACE) 50 MG/5ML liquid 100 mg  100 mg Per Tube BID Teressa Lower, NP   100 mg at 03/18/22 2128   famotidine (PEPCID) IVPB 20 mg premix  20 mg Intravenous Q24H Teressa Lower, NP   Stopped at 03/18/22 1014   feeding supplement (VITAL HIGH PROTEIN) liquid 1,000 mL  1,000 mL Per Tube Continuous Flora Lipps, MD 60 mL/hr at 03/19/22 0700 Infusion Verify at 03/19/22 0700   fentaNYL (SUBLIMAZE) bolus via infusion 25-100 mcg  25-100 mcg Intravenous Q15 min PRN Teressa Lower, NP   100 mcg at 03/19/22 0757   fentaNYL 2578mg in NS 2527m(1040mml) infusion-PREMIX  25-200 mcg/hr Intravenous Continuous NelTeressa LowerP 20 mL/hr at 03/19/22 0808 200 mcg/hr at 03/19/22 0808   free water 30 mL  30 mL Per Tube Q4H KasFlora LippsD   30 mL at 03/19/22 0556   insulin aspart (  novoLOG) injection 0-15 Units  0-15 Units Subcutaneous Q4H Rust-Chester, Britton L, NP   3 Units at 03/19/22 0751   ipratropium-albuterol (DUONEB) 0.5-2.5 (3) MG/3ML nebulizer solution 3 mL  3 mL Nebulization Q6H Richarda Osmond, MD   3 mL at 03/19/22 0720   levofloxacin (LEVAQUIN) IVPB 750 mg  750 mg Intravenous Q24H Lorin Picket, [facility]   Stopped at 03/18/22 1835   methylPREDNISolone sodium succinate (SOLU-MEDROL) 40 mg/mL injection 40 mg  40 mg Intravenous Q12H Benita Gutter, RPH   40 mg at 03/18/22 2128   midazolam (VERSED) injection 1-2 mg  1-2 mg Intravenous Q1H PRN Teressa Lower, NP   2 mg at  03/18/22 2041   norepinephrine (LEVOPHED) 16 mg in 269m (0.064 mg/mL) premix infusion  0-40 mcg/min Intravenous Titrated Rust-Chester, Britton L, NP 5.63 mL/hr at 03/19/22 0700 6 mcg/min at 03/19/22 0700   ondansetron (ZOFRAN) tablet 4 mg  4 mg Oral Q6H PRN DAthena Masse MD       Or   ondansetron ([facility] injection 4 mg  4 mg Intravenous Q6H PRN DAthena Masse MD   4 mg at 03/14/22 1723   Oral care mouth rinse  15 mL Mouth Rinse Q2H KFlora Lipps MD   15 mL at 03/19/22 09150  Oral care mouth rinse  15 mL Mouth Rinse PRN KFlora Lipps MD       polyethylene glycol (MIRALAX / GLYCOLAX) packet 17 g  17 g Per Tube Daily NTeressa Lower NP   17 g at 03/18/22 0917   propofol (DIPRIVAN) 1000 MG/100ML infusion  5-50 mcg/kg/min Intravenous Titrated Rust-Chester, Britton L, NP 8.86 mL/hr at 03/19/22 0700 15 mcg/kg/min at 03/19/22 0700   simvastatin (ZOCOR) tablet 10 mg  10 mg Per Tube q1800 CBenita Gutter RPH   10 mg at 03/18/22 1702   sodium chloride flush (NS) 0.9 % injection 10-40 mL  10-40 mL Intracatheter Q12H KFlora Lipps MD   10 mL at 03/18/22 2137   sulfamethoxazole-trimethoprim (BACTRIM DS) 800-160 MG per tablet 2 tablet  2 tablet Per Tube Q8H CLockie MolaB, RPH   2 tablet at 03/19/22 0752    VITAL SIGNS: BP (!) 99/55   Pulse (!) 106   Temp 98.6 F (37 C) (Axillary)   Resp 14   Ht '5\' 5"'$  (1.651 m)   Wt 206 lb 9.1 oz (93.7 kg)   SpO2 (!) 88%   BMI 34.38 kg/m  Filed Weights   03/14/22 0053 03/19/22 0424  Weight: 217 lb (98.4 kg) 206 lb 9.1 oz (93.7 kg)    Estimated body mass index is 34.38 kg/m as calculated from the following:   Height as of this encounter: '5\' 5"'$  (1.651 m).   Weight as of this encounter: 206 lb 9.1 oz (93.7 kg).  LABS: CBC:    Component Value Date/Time   WBC 11.3 (H) 03/19/2022 0427   HGB 10.4 (L) 03/19/2022 0427   HCT 32.4 (L) 03/19/2022 0427   PLT 188 03/19/2022 0427   MCV 94.2 03/19/2022 0427   Comprehensive Metabolic Panel:    Component  Value Date/Time   NA 136 03/19/2022 0427   K 3.7 03/19/2022 0427   CL 98 03/19/2022 0427   CO2 29 03/19/2022 0427   BUN 25 (H) 03/19/2022 0427   CREATININE 0.97 03/19/2022 0427   GLUCOSE 182 (H) 03/19/2022 0427   CALCIUM 8.7 (L) 03/19/2022 0427   AST 24 03/16/2022 0613   ALT 32 03/16/2022 05697  ALKPHOS 121 03/16/2022 0613   BILITOT 0.8 03/16/2022 0613   PROT 6.0 (L) 03/16/2022 0613   ALBUMIN 2.3 (L) 03/19/2022 0427    RADIOGRAPHIC STUDIES: DG Abd 1 View  Result Date: 03/18/2022 CLINICAL DATA:  Nasal/orogastric tube placement. EXAM: ABDOMEN - 1 VIEW COMPARISON:  None Available. FINDINGS: Nasal/orogastric tube passes below the diaphragm, curling within the mid stomach, well positioned. Normal bowel gas pattern. IMPRESSION: Well-positioned nasal/orogastric tube. Electronically Signed   By: Lajean Manes M.D.   On: 03/18/2022 10:16   DG Chest Port 1 View  Result Date: 03/18/2022 CLINICAL DATA:  ET tube placement. EXAM: PORTABLE CHEST 1 VIEW COMPARISON:  03/17/2022 and older exams. FINDINGS: Endotracheal tube tip projects 2.2 cm above the [place]. Nasal/orogastric tube passes below the diaphragm, well into the stomach and below the included field of view. Bilateral airspace lung opacities are without significant change from the most recent prior exam. No convincing pleural effusion and no pneumothorax. IMPRESSION: 1. Well-positioned endotracheal tube and nasal/orogastric tube. 2. No change in bilateral airspace lung opacities. Electronically Signed   By: Lajean Manes M.D.   On: 03/18/2022 10:16   ECHOCARDIOGRAM COMPLETE  Result Date: 03/18/2022    ECHOCARDIOGRAM REPORT   Patient Name:   Erica Waters Date of Exam: 03/17/2022 Medical Rec #:  [id number]         Height:       65.0 in Accession #:    [id number]        Weight:       217.0 lb Date of Birth:  [birth year]         BSA:          2.048 m Patient Age:    46 years          BP:           138/75 mmHg Patient Gender: F                 HR:            79 bpm. Exam Location:  [facility] Procedure: 2D Echo, Cardiac Doppler and Color Doppler Indications:     Acute respiratory distress                  R06.03  History:         Patient has no prior history of Echocardiogram examinations.                  Stroke.  Sonographer:     Sherrie Sport Referring Phys:  [id number] Bradly Bienenstock Diagnosing Phys: Yolonda Kida MD  Sonographer Comments: Suboptimal apical window. IMPRESSIONS  1. TDS.  2. Left ventricular ejection fraction, by estimation, is 65 to 70%. The left ventricle has normal function. The left ventricle has no regional wall motion abnormalities. Left ventricular diastolic parameters are consistent with Grade I diastolic dysfunction (impaired relaxation).  3. Right ventricular systolic function is normal. The right ventricular size is normal.  4. The mitral valve is normal in structure. Trivial mitral valve regurgitation.  5. The aortic valve is normal in structure. Aortic valve regurgitation is not visualized. Conclusion(s)/Recommendation(s): Poor windows for evaluation of left ventricular function by transthoracic echocardiography. Would recommend an alternative means of evaluation. FINDINGS  Left Ventricle: Left ventricular ejection fraction, by estimation, is 65 to 70%. The left ventricle has normal function. The left ventricle has no regional wall motion abnormalities. The left ventricular internal cavity size was normal in size. There is  no  left ventricular hypertrophy. Left ventricular diastolic parameters are consistent with Grade I diastolic dysfunction (impaired relaxation). Right Ventricle: The right ventricular size is normal. No increase in right ventricular wall thickness. Right ventricular systolic function is normal. Left Atrium: Left atrial size was normal in size. Right Atrium: Right atrial size was normal in size. Pericardium: There is no evidence of pericardial effusion. Mitral Valve: The mitral valve is normal in structure. Trivial  mitral valve regurgitation. Tricuspid Valve: The tricuspid valve is normal in structure. Tricuspid valve regurgitation is trivial. Aortic Valve: The aortic valve is normal in structure. Aortic valve regurgitation is not visualized. Aortic valve mean gradient measures 5.0 mmHg. Aortic valve peak gradient measures 9.5 mmHg. Aortic valve area, by VTI measures 2.55 cm. Pulmonic Valve: The pulmonic valve was normal in structure. Pulmonic valve regurgitation is not visualized. Aorta: The ascending aorta was not well visualized. IAS/Shunts: No atrial level shunt detected by color flow Doppler. Additional Comments: TDS.  LEFT VENTRICLE PLAX 2D LVIDd:         3.30 cm   Diastology LVIDs:         2.10 cm   LV e' medial:    5.66 cm/s LV PW:         1.50 cm   LV E/e' medial:  17.7 LV IVS:        1.30 cm   LV e' lateral:   7.07 cm/s LVOT diam:     2.00 cm   LV E/e' lateral: 14.1 LV SV:         80 LV SV Index:   39 LVOT Area:     3.14 cm  RIGHT VENTRICLE RV Basal diam:  3.70 cm RV Mid diam:    2.50 cm RV S prime:     17.00 cm/s TAPSE (M-mode): 1.8 cm LEFT ATRIUM             Index        RIGHT ATRIUM           Index LA diam:        2.70 cm 1.32 cm/m   RA Area:     10.60 cm LA Vol (A2C):   42.0 ml 20.51 ml/m  RA Volume:   20.50 ml  10.01 ml/m LA Vol (A4C):   17.7 ml 8.64 ml/m LA Biplane Vol: 28.0 ml 13.67 ml/m  AORTIC VALVE AV Area (Vmax):    2.45 cm AV Area (Vmean):   2.25 cm AV Area (VTI):     2.55 cm AV Vmax:           154.00 cm/s AV Vmean:          107.000 cm/s AV VTI:            0.315 m AV Peak Grad:      9.5 mmHg AV Mean Grad:      5.0 mmHg LVOT Vmax:         120.00 cm/s LVOT Vmean:        76.700 cm/s LVOT VTI:          0.256 m LVOT/AV VTI ratio: 0.81  AORTA Ao Root diam: 2.40 cm MITRAL VALVE                TRICUSPID VALVE MV Area (PHT): 2.47 cm     TR Peak grad:   38.4 mmHg MV Decel Time: 307 msec     TR Vmax:        310.00 cm/s MV E velocity: 100.00  cm/s MV A velocity: 138.00 cm/s  SHUNTS MV E/A ratio:  0.72          Systemic VTI:  0.26 m                             Systemic Diam: 2.00 cm Yolonda Kida MD Electronically signed by Yolonda Kida MD Signature Date/Time: 03/18/2022/8:21:07 AM    Final    [place] EKG SITE RITE  Result Date: 03/17/2022 If Site Rite image not attached, placement could not be confirmed due to current cardiac rhythm.  DG Chest Port 1 View  Result Date: 03/17/2022 CLINICAL DATA:  Shortness of breath EXAM: PORTABLE CHEST 1 VIEW COMPARISON:  Chest x-ray dated March 15, 2022 FINDINGS: Cardiac and mediastinal contours unchanged. Diffuse bilateral heterogeneous opacities, not significantly changed when compared with prior exam. No large pleural effusion or evidence of pneumothorax. IMPRESSION: Diffuse bilateral heterogeneous opacities, not significantly changed when compared with prior exam, differential considerations include multifocal infection or inflammatory etiology such as drug reaction. Electronically Signed   By: Yetta Glassman M.D.   On: 03/17/2022 09:37   DG Chest Port 1 View  Result Date: 03/15/2022 CLINICAL DATA:  Progressive shortness of breath. EXAM: PORTABLE CHEST 1 VIEW COMPARISON:  04/04/2022 FINDINGS: Heart size and mediastinal contours are unremarkable. Aortic atherosclerotic calcifications. Lung volumes are low. Progressive, bilateral upper and lower lobe interstitial and airspace opacities. IMPRESSION: Progressive, bilateral upper and lower lobe interstitial and airspace opacities compatible with multifocal pneumonia. Electronically Signed   By: Kerby Moors M.D.   On: 03/15/2022 17:32   CT Angio Chest PE W and/or Wo Contrast  Result Date: 03/09/2022 CLINICAL DATA:  Cough for 2 weeks, hypoxia, history of stage IV lung and breast cancer EXAM: CT ANGIOGRAPHY CHEST WITH CONTRAST TECHNIQUE: Multidetector CT imaging of the chest was performed using the standard protocol during bolus administration of intravenous contrast. Multiplanar CT image reconstructions and MIPs were  obtained to evaluate the vascular anatomy. RADIATION DOSE REDUCTION: This exam was performed according to the departmental dose-optimization program which includes automated exposure control, adjustment of the mA and/or kV according to patient size and/or use of iterative reconstruction technique. CONTRAST:  67m OMNIPAQUE IOHEXOL 350 MG/ML SOLN COMPARISON:  03/09/2022 FINDINGS: Cardiovascular: This is a technically adequate evaluation of the pulmonary vasculature. No filling defects or pulmonary emboli. The heart is unremarkable without pericardial effusion. No evidence of thoracic aortic aneurysm or dissection. Atherosclerosis of the aorta and coronary vasculature. Mediastinum/Nodes: No enlarged mediastinal, hilar, or axillary lymph nodes. Thyroid gland, trachea, and esophagus demonstrate no significant findings. Small hiatal hernia. Lungs/Pleura: There is diffuse increased interstitial prominence, with multifocal bilateral airspace disease as seen on preceding chest x-ray. I would favor multifocal bilateral pneumonia such as COVID-19 over metastatic disease. No effusion or pneumothorax. Central airways are patent. Upper Abdomen: Nodular thickening of the left adrenal gland measuring up to 18 mm, suspicious for metastatic disease in a patient with a history of lung and breast cancer. No other acute upper abdominal findings. Musculoskeletal: No acute or destructive bony lesions. Reconstructed images demonstrate no additional findings. Review of the MIP images confirms the above findings. IMPRESSION: 1. No evidence of pulmonary embolus. 2. Multifocal bilateral airspace disease, favor pneumonia over metastatic disease. Atypical viral etiologies such as COVID-19 should be considered based on pattern of findings. 3. Nodular thickening left adrenal gland, concerning for metastatic disease in a patient with a history of breast and lung  cancer. 4.  Aortic Atherosclerosis (ICD10-I70.0). Electronically Signed   By: Randa Ngo M.D.   On: 03/15/2022 23:24   DG Chest 2 View  Result Date: 03/25/2022 CLINICAL DATA:  Cough. Decreased oxygen saturation. History of stage IV lung and breast cancer. EXAM: CHEST - 2 VIEW COMPARISON:  None Available. FINDINGS: Heart size and mediastinal contours are unremarkable. Aortic atherosclerotic calcifications. Bilateral upper and lower lobe airspace opacities with diffuse increase interstitial markings identified. No signs of pleural effusion. Visualized osseous structures appear intact. IMPRESSION: Bilateral upper and lower lobe airspace opacities with diffuse increase interstitial markings. In the acute setting, imaging findings are concerning for multifocal pneumonia with superimposed interstitial edema. However, in the setting of stage IV lung cancer and breast cancer findings are less specific and may also be seen with diffuse pulmonary metastasis. Electronically Signed   By: Kerby Moors M.D.   On: 04/08/2022 14:55    PERFORMANCE STATUS (ECOG) : 4 - Bedbound  Review of Systems Unable to complete  Physical Exam General: Critically ill-appearing Pulmonary: On vent Extremities: no edema, no joint deformities Skin: no rashes Neurological: Sedated  IMPRESSION: Patient remains intubated in ICU, sedated, on pressors.  FiO2 70%.  I called and spoke with patient's husband.  He says that he recognizes that patient is critically ill and may not survive this hospitalization.  He is in agreement with the current scope of treatment but we discussed the option of transitioning to comfort measures/terminal extubation in the event of no improvement or further decline.  Husband says that patient told him prior to coming to the hospital that she "cannot live like this" in reference to pain and shortness of breath.  PLAN: -Continue current scope of treatment -Agree with DNR   Time Total: 25 minutes  Visit consisted of counseling and education dealing with the complex and emotionally  intense issues of symptom management and palliative care in the setting of serious and potentially life-threatening illness.Greater than 50%  of this time was spent counseling and coordinating care related to the above assessment and plan.  Signed by: Altha Harm, PhD, NP-C

## 2022-03-19 NOTE — Procedures (Signed)
PROCEDURE: BRONCHOSCOPY Therapeutic Aspiration of Tracheobronchial Tree  PROCEDURE DATE: 03/19/2022  TIME:  NAME:  Erica Waters  DOB:Sep 25, 1945  MRN: [id number] LOC:  IC19A/IC19A-AA    HOSP DAY: '@LENGTHOFSTAYDAYS'$ @ CODE STATUS:      Code Status Orders  (From admission, onward)           Start     Ordered   03/18/22 1106  Do not attempt resuscitation (DNR)  Continuous       Question Answer Comment  If patient has no pulse and is not breathing Do Not Attempt Resuscitation   If patient has a pulse and/or is breathing: Medical Treatment Goals MEDICAL INTERVENTIONS DESIRED: Use advanced airway interventions, mechanical ventilation or cardioversion in appropriate circumstances; Use medication/IV fluids as indicated; Provide comfort medications; Transfer to Progressive/Stepdown/ICU as indicated.   Consent: Discussion documented in EHR or advanced directives reviewed      03/18/22 1105           Code Status History     Date Active Date Inactive Code Status Order ID Comments User Context   03/30/2022 2356 03/18/2022 1105 Full Code [id number]  Athena Masse, MD ED           Indications/Preliminary Diagnosis: Pneumonia  Consent: (Place X beside choice/s below)  The benefits, risks and possible complications of the procedure were        explained to:  ___ patient  _x__ patient's family  ___ other:___________  who verbalized understanding and gave:  ___ verbal  ___ written  _x__ verbal and written  ___ telephone  ___ other:________ consent.      Unable to obtain consent; procedure performed on emergent basis.     Other:       PRESEDATION ASSESSMENT: History and Physical has been performed. Patient meds and allergies have been reviewed. Presedation airway examination has been performed and documented. Baseline vital signs, sedation score, oxygenation status, and cardiac rhythm were reviewed. Patient was deemed to be in satisfactory condition to undergo the procedure.       PROCEDURE DETAILS: Timeout performed and correct patient, name, & ID confirmed. Following prep per Pulmonary policy, appropriate sedation was administered. The Bronchoscope was inserted in to oral cavity with bite block in place. Therapeutic aspiration of Tracheobronchial tree was performed.  Airway exam proceeded with findings, technical procedures, and specimen collection as noted below. At the end of exam the scope was withdrawn without incident. Impression and Plan as noted below.           Airway Prep (Place X beside choice below)   1% Transtracheal Lidocaine Anesthetization 7 cc   Patient prepped per Bronchoscopy Lab Policy       Insertion Route (Place X beside choice below)   Nasal   Oral  x Endotracheal Tube   Tracheostomy     TECHNICAL PROCEDURES: (Place X beside choice below)   Procedures  Description    None     Electrocautery     Cryotherapy     Balloon Dilatation     Bronchography     Stent Placement   x  Therapeutic Aspiration Multiple mucus plugs    Laser/Argon Plasma    Brachytherapy Catheter Placement    Foreign Body Removal         SPECIMENS (Sites): (Place X beside choice below)  Specimens Description   No Specimens Obtained     [place]   x Lavage RT LUNG LAVAGE, MUCUS PLUGS EXTRACTED   Biopsies    Fine Needle  Aspirates    Brushings    Sputum    FINDINGS: MUCUS PLUGS  ESTIMATED BLOOD LOSS: none COMPLICATIONS/RESOLUTION: none      IMPRESSION:POST-PROCEDURE DX:  PNEUMONIA/PNEUMONITIS    RECOMMENDATION/PLAN:   FOLLOW UP CULTURES AND CYTOLOGY   Corrin Parker, M.D.  [facility]  Medical Director [place] Director [place] Department

## 2022-03-19 NOTE — Progress Notes (Addendum)
Pharmacy Antibiotic Note  Erica Waters is a 77 y.o. female admitted on 04/03/2022 with pneumonia. PMH significant for PVD, HLD, HTN, AF (on Eliquis), breast cancer with metastasis to R axillary lymph nodes, MDD, former smoker. Patient has documented penicillin allergy without documentation of previously tolerating beta-lactams. Morning of 1/10, patient having more trouble breathing and opted for intubation. Pharmacy has been consulted for vancomycin and meropenem dosing.    Plan: Day 6 of antibiotics Discontinue levofloxacin and Bactrim  Give Vancomycin 2000 mg IV x1 followed by 1250 mg IV Q24H. Goal AUC 400-550. Expected AUC: 443.0 Expected Css min: 11.4 SCr used: 0.97  Weight used: IBW, Vd used: 0.72 (BMI 34.3) Start meropenem 1 g IV Q8H Continue to monitor renal function and follow culture results   Height: _0  (165.1 cm) Weight: 93.7 kg (206 lb 9.1 oz) IBW/kg (Calculated) : 57  Temp (24hrs), Avg:98.7 F (37.1 C), Min:97.9 F (36.6 C), Max:99.1 F (37.3 C)  Recent Labs  Lab 03/29/2022 2239 03/14/22 0328 03/15/22 0411 03/16/22 1194 03/17/22 0539 03/18/22 0605 03/19/22 0427  WBC  --    < > 7.9 9.3 8.3 9.4 11.3*  CREATININE  --    < > 1.00 0.90 0.92 0.85 0.97  LATICACIDVEN 1.4  --   --   --   --   --   --    < > = values in this interval not displayed.    Estimated Creatinine Clearance: 56.7 mL/min (by C-G formula based on SCr of 0.97 mg/dL).    Allergies  Allergen Reactions   Penicillin G Hives and Shortness Of Breath    Antimicrobials this admission: 1/5 Levofloxacin >> 1/11 1/9 Bactrim >> 1/11 1/11 Vancomycin >> 1/11 Meropenem >>  Dose adjustments this admission: 1/7 Levofloxacin 750 mg Q48H >> Levofloxacin 750 mg Q24H  Microbiology results: 1/5 BCx: NG final 1/7 BCx: NG3D 1/8 MRSA PCR: negative 1/10 Sputum: IP 1/11 BAL Cx: IP  Thank you for allowing pharmacy to be a part of this patient's care.  Gretel Acre, PharmD PGY1 Pharmacy  Resident 03/19/2022 10:46 AM

## 2022-03-20 LAB — BASIC METABOLIC PANEL
Anion gap: 7 (ref 5–15)
BUN: 24 mg/dL — ABNORMAL HIGH (ref 8–23)
CO2: 26 mmol/L (ref 22–32)
Calcium: 7.6 mg/dL — ABNORMAL LOW (ref 8.9–10.3)
Chloride: 99 mmol/L (ref 98–111)
Creatinine, Ser: 0.69 mg/dL (ref 0.44–1.00)
GFR, Estimated: >60 mL/min (ref >60)
Glucose, Bld: 245 mg/dL — ABNORMAL HIGH (ref 70–99)
Potassium: 3.7 mmol/L (ref 3.5–5.1)
Sodium: 132 mmol/L — ABNORMAL LOW (ref 135–145)

## 2022-03-20 LAB — CBC WITH DIFFERENTIAL/PLATELET
Abs Immature Granulocytes: 0.11 10*3/uL — ABNORMAL HIGH (ref 0.00–0.07)
Basophils Absolute: 0 10*3/uL (ref 0.0–0.1)
Basophils Relative: 0 %
Eosinophils Absolute: 0 10*3/uL (ref 0.0–0.5)
Eosinophils Relative: 0 %
HCT: 30.4 % — ABNORMAL LOW (ref 36.0–46.0)
Hemoglobin: 9.8 g/dL — ABNORMAL LOW (ref 12.0–15.0)
Immature Granulocytes: 1 %
Lymphocytes Relative: 7 %
Lymphs Abs: 0.7 10*3/uL (ref 0.7–4.0)
MCH: 30.9 pg (ref 26.0–34.0)
MCHC: 32.2 g/dL (ref 30.0–36.0)
MCV: 95.9 fL (ref 80.0–100.0)
Monocytes Absolute: 0.6 10*3/uL (ref 0.1–1.0)
Monocytes Relative: 5 %
Neutro Abs: 9.3 10*3/uL — ABNORMAL HIGH (ref 1.7–7.7)
Neutrophils Relative %: 87 %
Platelets: 134 10*3/uL — ABNORMAL LOW (ref 150–400)
RBC: 3.17 MIL/uL — ABNORMAL LOW (ref 3.87–5.11)
RDW: 15.9 % — ABNORMAL HIGH (ref 11.5–15.5)
WBC: 10.7 10*3/uL — ABNORMAL HIGH (ref 4.0–10.5)
nRBC: 0.3 % — ABNORMAL HIGH (ref 0.0–0.2)

## 2022-03-20 LAB — CULTURE, BLOOD (ROUTINE X 2)
Culture: NO GROWTH
Special Requests: ADEQUATE

## 2022-03-20 LAB — GLUCOSE, CAPILLARY
Glucose-Capillary: 254 mg/dL — ABNORMAL HIGH (ref 70–99)
Glucose-Capillary: 193 mg/dL — ABNORMAL HIGH (ref 70–99)
Glucose-Capillary: 148 mg/dL — ABNORMAL HIGH (ref 70–99)
Glucose-Capillary: 190 mg/dL — ABNORMAL HIGH (ref 70–99)
Glucose-Capillary: 191 mg/dL — ABNORMAL HIGH (ref 70–99)
Glucose-Capillary: 172 mg/dL — ABNORMAL HIGH (ref 70–99)

## 2022-03-20 LAB — PHOSPHORUS: Phosphorus: 2.3 mg/dL — ABNORMAL LOW (ref 2.5–4.6)

## 2022-03-20 LAB — MAGNESIUM: Magnesium: 2.2 mg/dL (ref 1.7–2.4)

## 2022-03-20 LAB — CYTOLOGY - NON PAP

## 2022-03-20 MED ORDER — INSULIN ASPART 100 UNIT/ML IJ SOLN
3.0000 [IU] | INTRAMUSCULAR | Status: DC
  Administered 2022-03-20 – 2022-03-21 (×9): 3 [IU] via SUBCUTANEOUS
  Filled 2022-03-20 (×9): qty 1

## 2022-03-20 MED ORDER — VECURONIUM BROMIDE 10 MG IV SOLR
10.0000 mg | INTRAVENOUS | Status: DC | PRN
  Administered 2022-03-20: 10 mg via INTRAVENOUS
  Filled 2022-03-20 (×2): qty 10

## 2022-03-20 MED ORDER — VANCOMYCIN HCL 1500 MG/300ML IV SOLN
1500.0000 mg | INTRAVENOUS | Status: DC
  Administered 2022-03-20: 1500 mg via INTRAVENOUS
  Filled 2022-03-20: qty 300

## 2022-03-20 NOTE — Progress Notes (Signed)
NAME:  Erica Waters, MRN:  [id number], DOB:  03-30-1945, LOS: 7 ADMISSION DATE:  03/09/2022   BRIEF SYNOPSIS RESP DISTRESS  History of Present Illness:  77 y.o. 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 and a former smoker who presents to the ED from urgent care for evaluation of hypoxia/SOB.     She presented there with a 2-week history of a dry cough and shortness of breath and generalized malaise and was found to be hypoxic to the 60s.  She was placed on O2 at 3 L with improvement to the 90s and sent to the ED.    ED course and data review: afebrile with pulse in the 70s to 80s, respirations 22, BP 112/49 and O2 sat initially 87% on 3 L improving to high 90s on 4 L.   wBC 8200 with lactic acid 1.4.   Respiratory viral panel negative for COVID flu and RSV.   Troponin 21 and BNP 27.   BMP significant for creatinine of 1.46 above baseline of 0.98, with bicarb of 18 and sodium 128.    CTA chest negative for PE and showing multifocal pneumonia versus metastatic disease.  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   Patient was started on Levaquin due to history of severe penicillin allergy and given an IV fluid bolus of NS   1/8 PCCM CONSULTED FOR SEVERE HYPOXIA AND WOB PLACED ON BIPAP 03/20/22- patient on 100% FiO2, I met with husband today and reviewed hospital course, current findings and medical plan. Further we discussed goals of care.  Currently she is with very poor prognosis with anticipation of in hospital death.   ONCOLOGY  Medical History   Oncology History Overview Note Denovo metastatic disease ER 99% PR 99% HER1 1+ FISH neg, to mediastinal LAD, stage IV Diagnosis: Screening mammo - abnormal R breast area 06/23/21 07/17/21 - diagnostic mammo - 3.1cm mass in R upper outer, abnormal R axillary LN 08/12/21 - Bx - c/w IDC, grade 2, LN bx  + 09/24/21 - CT CAP concerning for hilar LAD and 2 pulmonary nodules c/f metastatic disease, bone scan negative 10/01/21 - bronchoscopy bx of mediastinal LN - metastatic adenocarcinoma c/w breast 10/17/21 - estradiol PET - ER+ disease in R breast tissue, pulmonary and bone lesions, R axillary, mediastinal and bilateral hilar adenopathy 11/06/21 - started letrozole, plan for abemaciclib start '50mg'$  BID  Significant Hospital Events: Including procedures, antibiotic start and stop dates in addition to other pertinent events   ADMITTED 1/5 FOR HYPOXIA AND SOB 1/8 PCCM CONSULTED FOR SEVERE HYPOXIA 1/9 severe hypoxia fio2 at 93% 1/10 fio2 at 83% severe SOB and hypoxia, emergently intubated      Micro Data:  COVID/FLU/RSV NEG   IMAGING - SEVERE BILATERAL PNEUMONIA   Antibiotics Given (last 72 hours)     Date/Time Action Medication Dose Rate   03/17/22 1624 Given   sulfamethoxazole-trimethoprim (BACTRIM DS) 800-160 MG per tablet 2 tablet 2 tablet    03/17/22 1759 New Bag/Given   levofloxacin (LEVAQUIN) IVPB 750 mg 750 mg 100 mL/hr   03/17/22 2154 Given   sulfamethoxazole-trimethoprim (BACTRIM DS) 800-160 MG per tablet 2 tablet 2 tablet    03/18/22 0600 Given   sulfamethoxazole-trimethoprim (BACTRIM DS) 800-160 MG per tablet 2 tablet 2 tablet    03/18/22 1415 Given   sulfamethoxazole-trimethoprim (BACTRIM DS) 800-160 MG per tablet 2 tablet 2 tablet    03/18/22 1705  New Bag/Given   levofloxacin (LEVAQUIN) IVPB 750 mg 750 mg 100 mL/hr   03/19/22 0000 Given   sulfamethoxazole-trimethoprim (BACTRIM DS) 800-160 MG per tablet 2 tablet 2 tablet    03/19/22 0752 Given   sulfamethoxazole-trimethoprim (BACTRIM DS) 800-160 MG per tablet 2 tablet 2 tablet    03/19/22 1250 New Bag/Given   vancomycin (VANCOREADY) IVPB 2000 mg/400 mL 2,000 mg 200 mL/hr   03/19/22 1512 New Bag/Given   meropenem (MERREM) 1 g in sodium chloride 0.9 % 100 mL IVPB 1 g 200 mL/hr   03/19/22 2212 New Bag/Given   meropenem  (MERREM) 1 g in sodium chloride 0.9 % 100 mL IVPB 1 g 200 mL/hr   03/19/22 2259 New Bag/Given   vancomycin (VANCOREADY) IVPB 1250 mg/250 mL 1,250 mg 166.7 mL/hr   03/20/22 0520 New Bag/Given   meropenem (MERREM) 1 g in sodium chloride 0.9 % 100 mL IVPB 1 g 200 mL/hr        EVENTS OVERNIGHT Vent Mode: PRVC FiO2 (%):  [90 %-100 %] 100 % Set Rate:  [18 bmp] 18 bmp Vt Set:  [450 mL] 450 mL PEEP:  [5 cmH20] 5 cmH20 Plateau Pressure:  [28 cmH20] 28 cmH20 SEVERE HYPOXIA ALI/ARDS UNABLE TO WEAN FROM VENT PLAN  FOR BRONCH TODAY       Latest Ref Rng & Units 03/20/2022    5:45 AM 03/19/2022    4:27 AM 03/18/2022    6:05 AM  BMP  Glucose 70 - 99 mg/dL 245  182  136   BUN 8 - 23 mg/dL '24  25  19   '$ Creatinine 0.44 - 1.00 mg/dL 0.69  0.97  0.85   Sodium 135 - 145 mmol/L 132  136  137   Potassium 3.5 - 5.1 mmol/L 3.7  3.7  3.9   Chloride 98 - 111 mmol/L 99  98  101   CO2 22 - 32 mmol/L '26  29  25   '$ Calcium 8.9 - 10.3 mg/dL 7.6  8.7  8.9       Objective   Blood pressure (!) 96/50, pulse (!) 114, temperature 98.1 F (36.7 C), temperature source Axillary, resp. rate 16, height '5\' 5"'$  (1.651 m), weight 96.6 kg, SpO2 (!) 89 %.    Vent Mode: PRVC FiO2 (%):  [90 %-100 %] 100 % Set Rate:  [18 bmp] 18 bmp Vt Set:  [450 mL] 450 mL PEEP:  [5 cmH20] 5 cmH20 Plateau Pressure:  [28 cmH20] 28 cmH20   Intake/Output Summary (Last 24 hours) at 03/20/2022 0953 Last data filed at 03/20/2022 0831 Gross per 24 hour  Intake 4703.61 ml  Output 1400 ml  Net 3303.61 ml    Filed Weights   03/14/22 0053 03/19/22 0424 03/20/22 0500  Weight: 98.4 kg 93.7 kg 96.6 kg      REVIEW OF SYSTEMS  PATIENT IS UNABLE TO PROVIDE COMPLETE REVIEW OF SYSTEMS DUE TO SEVERE CRITICAL ILLNESS   PHYSICAL EXAMINATION:  GENERAL:critically ill appearing, +resp distress EYES: Pupils equal, round, reactive to light.  No scleral icterus.  MOUTH: Moist mucosal membrane. INTUBATED NECK: Supple.  PULMONARY: Lungs clear  to auscultation, +rhonchi, +wheezing CARDIOVASCULAR: S1 and S2.  Regular rate and rhythm GASTROINTESTINAL: Soft, nontender, -distended. Positive bowel sounds.  MUSCULOSKELETAL: No swelling, clubbing, or edema.  NEUROLOGIC: obtunded,sedated SKIN:normal, warm to touch, Capillary refill delayed  Pulses present bilaterally  Labs/imaging that I havepersonally reviewed  (right click and "Reselect all SmartList Selections" daily)       ASSESSMENT AND  PLAN SYNOPSIS  76 YO WHITE FEMALE WITH METASTATIC BREAST CANCER ON IMMUNOTHERAPY WITH PROGRESSIVE HYPOXIA AND RESP FAILURE, ETIOLOGIES INCLUDE DRUG INDUCED TOXICITY FROM IMMUNOTHERAPY, METASTATIC BEAST CANCER, INFECTIOUS CAUSES(ATYPICAL, PCP) PULM EDEMA LEADING TO SEVERE RESP FAILURE REQUIRING MV SUPPORT  Severe ACUTE Hypoxic and Hypercapnic Respiratory Failure -continue Mechanical Ventilator support -Wean Fio2 and PEEP as tolerated -VAP/VENT bundle implementation - Wean PEEP & FiO2 as tolerated, maintain SpO2 > 88% - Head of bed elevated 30 degrees, VAP protocol in place - Plateau pressures less than 30 cm H20  - Intermittent chest x-ray & ABG PRN - Ensure adequate pulmonary hygiene  UNable to perform SAT/SBT   CONSIDER LASIX THERAPY as BP tolerates Continue IV STEROIDS NEBS Vent Mode: PRVC FiO2 (%):  [90 %-100 %] 100 % Set Rate:  [18 bmp] 18 bmp Vt Set:  [450 mL] 450 mL PEEP:  [5 cmH20] 5 cmH20 Plateau Pressure:  [28 cmH20] 28 cmH20   CARDIAC ICU monitoring   RENAL -continue Foley Catheter-assess need -Avoid nephrotoxic agents -Follow urine output, BMP -Ensure adequate renal perfusion, optimize oxygenation -Renal dose medications   Intake/Output Summary (Last 24 hours) at 03/20/2022 0953 Last data filed at 03/20/2022 0831 Gross per 24 hour  Intake 4703.61 ml  Output 1400 ml  Net 3303.61 ml       INFECTIOUS DISEASE -continue antibiotics as prescribed -follow up cultures PLAN FOR BRONCH TODAY  NUTRITIONAL  STATUS DIET-->as tolerated Constipation protocol as indicated  ENDO - ICU hypoglycemic\Hyperglycemia protocol -check FSBS per protocol   GI GI PROPHYLAXIS as indicated  NUTRITIONAL STATUS DIET-->TF's as tolerated Constipation protocol as indicated   ELECTROLYTES -follow labs as needed -replace as needed -pharmacy consultation and following   Best practice (right click and "Reselect all SmartList Selections" daily)     Best practice     Diet:TF's Pain/Anxiety/Delirium protocol (if indicated): Yes (RASS goal -2) VAP protocol (if indicated): Yes DVT prophylaxis: LMWH GI prophylaxis: H2B Glucose control:  SSI Yes Central venous access:  PICC LINE PLACED 1/10 Arterial line:  N/A Foley:  Yes, and it is still needed Mobility:  bed rest  Code Status:  DNR Disposition:ICU    Labs   CBC: Recent Labs  Lab 03/16/22 0613 03/17/22 0539 03/18/22 0605 03/19/22 0427 03/20/22 0545  WBC 9.3 8.3 9.4 11.3* 10.7*  NEUTROABS  --   --   --   --  9.3*  HGB 10.5* 10.5* 11.0* 10.4* 9.8*  HCT 32.3* 31.2* 33.1* 32.4* 30.4*  MCV 92.0 91.5 91.2 94.2 95.9  PLT 245 188 191 188 134*     Basic Metabolic Panel: Recent Labs  Lab 03/16/22 0613 03/17/22 0539 03/18/22 0605 03/19/22 0427 03/20/22 0545  NA 136 137 137 136 132*  K 3.8 3.2* 3.9 3.7 3.7  CL 103 101 101 98 99  CO2 '24 27 25 29 26  '$ GLUCOSE 113* 131* 136* 182* 245*  BUN '9 18 19 '$ 25* 24*  CREATININE 0.90 0.92 0.85 0.97 0.69  CALCIUM 8.8* 8.9 8.9 8.7* 7.6*  MG  --  2.1 2.3 2.3 2.2  PHOS  --  2.9 2.8 2.7 2.3*    GFR: Estimated Creatinine Clearance: 69.8 mL/min (by C-G formula based on SCr of 0.69 mg/dL). Recent Labs  Lab 04/02/2022 1424 04/08/2022 2239 03/14/22 0328 03/16/22 1213 03/17/22 0539 03/18/22 0605 03/19/22 0427 03/19/22 1619 03/20/22 0545  PROCALCITON 0.22  --   --  0.43 0.50 0.30  --   --   --   WBC 8.2  --    < >  --  8.3 9.4 11.3*  --  10.7*  LATICACIDVEN  --  1.4  --   --   --   --   --  1.6  --     < > = values in this interval not displayed.     Liver Function Tests: Recent Labs  Lab 03/15/22 0411 03/16/22 3154 03/17/22 0539 03/18/22 0605 03/19/22 0427  AST 34 24  --   --   --   ALT 40 32  --   --   --   ALKPHOS 92 121  --   --   --   BILITOT 0.7 0.8  --   --   --   PROT 5.8* 6.0*  --   --   --   ALBUMIN 2.3* 2.3* 2.3* 2.4* 2.3*    No results for input(s): "LIPASE", "AMYLASE" in the last 168 hours. No results for input(s): "AMMONIA" in the last 168 hours.  ABG    Component Value Date/Time   PHART 7.29 (L) 03/18/2022 1100   PCO2ART 61 (H) 03/18/2022 1100   PO2ART 87 03/18/2022 1100   HCO3 29.3 (H) 03/18/2022 1100   O2SAT 96.6 03/18/2022 1100      DVT/GI PRX  assessed I Assessed the need for Labs I Assessed the need for Foley I Assessed the need for Central Venous Line Family Discussion when available I Assessed the need for Mobilization I made an Assessment of medications to be adjusted accordingly Safety Risk assessment completed  CASE DISCUSSED IN MULTIDISCIPLINARY ROUNDS WITH ICU TEAM     Critical care provider statement:   Total critical care time: 33 minutes   Performed by: Lanney Gins MD   Critical care time was exclusive of separately billable procedures and treating other patients.   Critical care was necessary to treat or prevent imminent or life-threatening deterioration.   Critical care was time spent personally by me on the following activities: development of treatment plan with patient and/or surrogate as well as nursing, discussions with consultants, evaluation of patient's response to treatment, examination of patient, obtaining history from patient or surrogate, ordering and performing treatments and interventions, ordering and review of laboratory studies, ordering and review of radiographic studies, pulse oximetry and re-evaluation of patient's condition.    Ottie Glazier, M.D.  Pulmonary & Critical Care Medicine

## 2022-03-20 NOTE — Progress Notes (Signed)
Pt VSS on levophed. Pt dyssynchronous with ventilator and prn paralytic ordered. Unable to wean sedation due to dyssynchrony. L foot noted to have mottled cyanotic toes at 1800. Unable to doppler LLE pedal/tibial pulse, charge RN verified. Pt does have L fem pulse. NP made aware. R foot Tibial pulses dopplered and marked, color appropriate.

## 2022-03-20 NOTE — Progress Notes (Signed)
Pharmacy Antibiotic Note  PRISHA HILEY is a 77 y.o. female admitted on 03/30/2022 with pneumonia. PMH significant for PVD, HLD, HTN, AF (on Eliquis), breast cancer with metastasis to R axillary lymph nodes, MDD, former smoker. Patient has documented penicillin allergy without documentation of previously tolerating beta-lactams. Morning of 1/10, patient having more trouble breathing and opted for intubation. Pharmacy has been consulted for vancomycin and meropenem dosing.    Plan: Day 7 of antibiotics Increase Vancomycin from 1250 mg IV Q24H to 1500 mg IV Q24H. Goal AUC 400-550. Expected AUC: 446.6 Expected Css min: 10.4 SCr used: 0.8 (actual 0.69)  Weight used: IBW, Vd used: 0.72 (BMI 34.3) Continue meropenem 1 g IV Q8H Continue to monitor renal function and follow culture results   Height: 5' 5"  (165.1 cm) Weight: 96.6 kg (212 lb 15.4 oz) IBW/kg (Calculated) : 57  Temp (24hrs), Avg:98 F (36.7 C), Min:97.7 F (36.5 C), Max:98.6 F (37 C)  Recent Labs  Lab 03/12/2022 2239 03/14/22 0328 03/16/22 0613 03/17/22 0539 03/18/22 0605 03/19/22 0427 03/19/22 1619 03/20/22 0545  WBC  --    < > 9.3 8.3 9.4 11.3*  --  10.7*  CREATININE  --    < > 0.90 0.92 0.85 0.97  --  0.69  LATICACIDVEN 1.4  --   --   --   --   --  1.6  --    < > = values in this interval not displayed.     Estimated Creatinine Clearance: 69.8 mL/min (by C-G formula based on SCr of 0.69 mg/dL).    Allergies  Allergen Reactions   Penicillin G Hives and Shortness Of Breath    Antimicrobials this admission: 1/5 Levofloxacin >> 1/11 1/9 Bactrim >> 1/11 1/11 Vancomycin >> 1/11 Meropenem >>  Dose adjustments this admission: 1/7 Levofloxacin 750 mg Q48H >> Levofloxacin 750 mg Q24H 1/12 Vancomycin 1250 mg IV Q24H >> Vancomycin 1500 mg IV Q24H   Microbiology results: 1/5 BCx: NG final 1/7 BCx: NG final 1/8 MRSA PCR: negative 1/10 Sputum: IP 1/11 BAL Cx: IP  Thank you for allowing pharmacy to be a part of  this patient's care.  Gretel Acre, PharmD PGY1 Pharmacy Resident 03/20/2022 7:37 AM

## 2022-03-20 NOTE — IPAL (Signed)
GOALS OF CARE FAMILY CONFERENCE   Current clinical status, hospital findings and medical plan was reviewed with family.   Updated and notified of patients ongoing immediate critical medical problems.   Patient remains unresponsive acutely comatose    Patient is unable to breathe independently, unable to protect airway and unable to mobilize secretions.    Explained to family course of therapy and the modalities   I spoke with husband Mr Rhine and Son Mr Snowball today   Patient with Progressive multiorgan failure with high probability of a very minimal chance of meaningful recovery despite aggressive and optimal medical therapy.   Family is appreciative of care and relate understanding that patient is severely critically ill with anticipation of passing away during this hospitalization.   They have consented and agreed to DNR Code status   Family are satisfied with Plan of action and management. All questions answered  Additional Critical Care time 35 mins    Ottie Glazier, M.D.  Pulmonary & [place]

## 2022-03-21 ENCOUNTER — Inpatient Hospital Stay: Payer: Medicare Other

## 2022-03-21 LAB — CBC
HCT: 33.4 % — ABNORMAL LOW (ref 36.0–46.0)
Hemoglobin: 9.9 g/dL — ABNORMAL LOW (ref 12.0–15.0)
MCH: 29.8 pg (ref 26.0–34.0)
MCHC: 29.6 g/dL — ABNORMAL LOW (ref 30.0–36.0)
MCV: 100.6 fL — ABNORMAL HIGH (ref 80.0–100.0)
Platelets: 145 10*3/uL — ABNORMAL LOW (ref 150–400)
RBC: 3.32 MIL/uL — ABNORMAL LOW (ref 3.87–5.11)
RDW: 16.3 % — ABNORMAL HIGH (ref 11.5–15.5)
WBC: 11.7 10*3/uL — ABNORMAL HIGH (ref 4.0–10.5)
nRBC: 0.4 % — ABNORMAL HIGH (ref 0.0–0.2)

## 2022-03-21 LAB — GLUCOSE, CAPILLARY
Glucose-Capillary: 217 mg/dL — ABNORMAL HIGH (ref 70–99)
Glucose-Capillary: 193 mg/dL — ABNORMAL HIGH (ref 70–99)
Glucose-Capillary: 176 mg/dL — ABNORMAL HIGH (ref 70–99)
Glucose-Capillary: 160 mg/dL — ABNORMAL HIGH (ref 70–99)
Glucose-Capillary: 200 mg/dL — ABNORMAL HIGH (ref 70–99)
Glucose-Capillary: 193 mg/dL — ABNORMAL HIGH (ref 70–99)

## 2022-03-21 LAB — RENAL FUNCTION PANEL
Albumin: 2.1 g/dL — ABNORMAL LOW (ref 3.5–5.0)
Anion gap: 6 (ref 5–15)
BUN: 51 mg/dL — ABNORMAL HIGH (ref 8–23)
CO2: 30 mmol/L (ref 22–32)
Calcium: 8.1 mg/dL — ABNORMAL LOW (ref 8.9–10.3)
Chloride: 102 mmol/L (ref 98–111)
Creatinine, Ser: 1.23 mg/dL — ABNORMAL HIGH (ref 0.44–1.00)
GFR, Estimated: 46 mL/min — ABNORMAL LOW (ref >60)
Glucose, Bld: 197 mg/dL — ABNORMAL HIGH (ref 70–99)
Phosphorus: 4.7 mg/dL — ABNORMAL HIGH (ref 2.5–4.6)
Potassium: 5.1 mmol/L (ref 3.5–5.1)
Sodium: 138 mmol/L (ref 135–145)

## 2022-03-21 LAB — PHOSPHORUS: Phosphorus: 4.7 mg/dL — ABNORMAL HIGH (ref 2.5–4.6)

## 2022-03-21 LAB — VANCOMYCIN, RANDOM: Vancomycin Rm: 29 ug/mL

## 2022-03-21 MED ORDER — BISACODYL 10 MG RE SUPP
10.0000 mg | Freq: Once | RECTAL | Status: AC
  Administered 2022-03-21: 10 mg via RECTAL
  Filled 2022-03-21: qty 1

## 2022-03-21 MED ORDER — VANCOMYCIN VARIABLE DOSE PER UNSTABLE RENAL FUNCTION (PHARMACIST DOSING): Status: DC

## 2022-03-21 MED ORDER — SODIUM CHLORIDE 0.9 % IV SOLN
1.0000 g | Freq: Two times a day (BID) | INTRAVENOUS | Status: DC
  Administered 2022-03-21: 1 g via INTRAVENOUS
  Filled 2022-03-21 (×2): qty 20

## 2022-03-21 MED ORDER — INSULIN ASPART 100 UNIT/ML IJ SOLN
4.0000 [IU] | INTRAMUSCULAR | Status: DC
  Administered 2022-03-21 – 2022-03-22 (×4): 4 [IU] via SUBCUTANEOUS
  Filled 2022-03-21 (×4): qty 1

## 2022-03-21 NOTE — Progress Notes (Signed)
RN spoke with Dr. Lanney Gins and made MD aware that unable to doppler pulses in patient's left foot and that left foot is mottled. Made MD aware that Dewaine Conger, NP was made aware of these findings yesterday which occurred after levophed drip was started. MD acknowledged, no new orders given.

## 2022-03-21 NOTE — Progress Notes (Signed)
NAME:  Erica Waters, MRN:  [id number], DOB:  18-Nov-1945, LOS: 8 ADMISSION DATE:  03/31/2022   BRIEF SYNOPSIS RESP DISTRESS  History of Present Illness:  77 y.o. 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 and a former smoker who presents to the ED from urgent care for evaluation of hypoxia/SOB.     She presented there with a 2-week history of a dry cough and shortness of breath and generalized malaise and was found to be hypoxic to the 60s.  She was placed on O2 at 3 L with improvement to the 90s and sent to the ED.    ED course and data review: afebrile with pulse in the 70s to 80s, respirations 22, BP 112/49 and O2 sat initially 87% on 3 L improving to high 90s on 4 L.   wBC 8200 with lactic acid 1.4.   Respiratory viral panel negative for COVID flu and RSV.   Troponin 21 and BNP 27.   BMP significant for creatinine of 1.46 above baseline of 0.98, with bicarb of 18 and sodium 128.    CTA chest negative for PE and showing multifocal pneumonia versus metastatic disease.  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   Patient was started on Levaquin due to history of severe penicillin allergy and given an IV fluid bolus of NS   1/8 PCCM CONSULTED FOR SEVERE HYPOXIA AND WOB PLACED ON BIPAP 03/20/22- patient on 100% FiO2, I met with husband today and reviewed hospital course, current findings and medical plan. Further we discussed goals of care.  Currently she is with very poor prognosis with anticipation of in hospital death.  Mar 26, 2022- patient remains critically ill. Family making plans to come in prior to terminal weaning from ventilatory.  ONCOLOGY  Medical History   Oncology History Overview Note [place] metastatic disease ER 99% PR 99% HER1 1+ FISH neg, to mediastinal LAD, stage IV Diagnosis: Screening mammo - abnormal R breast area 06/23/21 07/17/21 -  diagnostic mammo - 3.1cm mass in R upper outer, abnormal R axillary LN 08/12/21 - Bx - c/w IDC, grade 2, LN bx + 09/24/21 - CT CAP concerning for hilar LAD and 2 pulmonary nodules c/f metastatic disease, bone scan negative 10/01/21 - bronchoscopy bx of mediastinal LN - metastatic adenocarcinoma c/w breast 10/17/21 - estradiol PET - ER+ disease in R breast tissue, pulmonary and bone lesions, R axillary, mediastinal and bilateral hilar adenopathy 11/06/21 - started letrozole, plan for abemaciclib start '50mg'$  BID  Significant Hospital Events: Including procedures, antibiotic start and stop dates in addition to other pertinent events   ADMITTED 1/5 FOR HYPOXIA AND SOB 1/8 PCCM CONSULTED FOR SEVERE HYPOXIA 1/9 severe hypoxia fio2 at 93% 1/10 fio2 at 83% severe SOB and hypoxia, emergently intubated      Micro Data:  COVID/FLU/RSV NEG   IMAGING - SEVERE BILATERAL PNEUMONIA   Antibiotics Given (last 72 hours)     Date/Time Action Medication Dose Rate   03/19/22 0000 Given   sulfamethoxazole-trimethoprim (BACTRIM DS) 800-160 MG per tablet 2 tablet 2 tablet    03/19/22 0752 Given   sulfamethoxazole-trimethoprim (BACTRIM DS) 800-160 MG per tablet 2 tablet 2 tablet    03/19/22 1250 New Bag/Given   vancomycin (VANCOREADY) IVPB 2000 mg/400 mL 2,000 mg 200 mL/hr   03/19/22 1512 New Bag/Given   meropenem (MERREM) 1 g in sodium chloride 0.9 % 100 mL IVPB 1 g 200 mL/hr  03/19/22 2212 New Bag/Given   meropenem (MERREM) 1 g in sodium chloride 0.9 % 100 mL IVPB 1 g 200 mL/hr   03/19/22 2259 New Bag/Given   vancomycin (VANCOREADY) IVPB 1250 mg/250 mL 1,250 mg 166.7 mL/hr   03/20/22 0520 New Bag/Given   meropenem (MERREM) 1 g in sodium chloride 0.9 % 100 mL IVPB 1 g 200 mL/hr   03/20/22 1332 New Bag/Given   meropenem (MERREM) 1 g in sodium chloride 0.9 % 100 mL IVPB 1 g 200 mL/hr   03/20/22 2123 New Bag/Given   meropenem (MERREM) 1 g in sodium chloride 0.9 % 100 mL IVPB 1 g 200 mL/hr   03/20/22 2215  New Bag/Given   vancomycin (VANCOREADY) IVPB 1500 mg/300 mL 1,500 mg 150 mL/hr   03/21/22 0528 New Bag/Given   meropenem (MERREM) 1 g in sodium chloride 0.9 % 100 mL IVPB 1 g 200 mL/hr        EVENTS OVERNIGHT Vent Mode: PRVC FiO2 (%):  [80 %-100 %] 90 % Set Rate:  [18 bmp] 18 bmp Vt Set:  [450 mL] 450 mL PEEP:  [5 cmH20-8 cmH20] 5 cmH20 Plateau Pressure:  [23 cmH20-25 cmH20] 23 cmH20 SEVERE HYPOXIA ALI/ARDS UNABLE TO WEAN FROM VENT PLAN  FOR BRONCH TODAY       Latest Ref Rng & Units 03/21/2022    2:06 AM 03/20/2022    5:45 AM 03/19/2022    4:27 AM  BMP  Glucose 70 - 99 mg/dL 197  245  182   BUN 8 - 23 mg/dL 51  24  25   Creatinine 0.44 - 1.00 mg/dL 1.23  0.69  0.97   Sodium 135 - 145 mmol/L 138  132  136   Potassium 3.5 - 5.1 mmol/L 5.1  3.7  3.7   Chloride 98 - 111 mmol/L 102  99  98   CO2 22 - 32 mmol/L '30  26  29   '$ Calcium 8.9 - 10.3 mg/dL 8.1  7.6  8.7       Objective   Blood pressure (!) 104/53, pulse (!) 118, temperature 98.7 F (37.1 C), temperature source Oral, resp. rate (!) 24, height '5\' 5"'$  (1.651 m), weight 98.3 kg, SpO2 95 %.    Vent Mode: PRVC FiO2 (%):  [80 %-100 %] 90 % Set Rate:  [18 bmp] 18 bmp Vt Set:  [450 mL] 450 mL PEEP:  [5 cmH20-8 cmH20] 5 cmH20 Plateau Pressure:  [23 cmH20-25 cmH20] 23 cmH20   Intake/Output Summary (Last 24 hours) at 03/21/2022 1725 Last data filed at 03/21/2022 1700 Gross per 24 hour  Intake 3382.51 ml  Output 1600 ml  Net 1782.51 ml    Filed Weights   03/19/22 0424 03/20/22 0500 03/21/22 0500  Weight: 93.7 kg 96.6 kg 98.3 kg      REVIEW OF SYSTEMS  PATIENT IS UNABLE TO PROVIDE COMPLETE REVIEW OF SYSTEMS DUE TO SEVERE CRITICAL ILLNESS   PHYSICAL EXAMINATION:  GENERAL:critically ill appearing, +resp distress EYES: Pupils equal, round, reactive to light.  No scleral icterus.  MOUTH: Moist mucosal membrane. INTUBATED NECK: Supple.  PULMONARY: Lungs clear to auscultation, +rhonchi, +wheezing CARDIOVASCULAR:  S1 and S2.  Regular rate and rhythm GASTROINTESTINAL: Soft, nontender, -distended. Positive bowel sounds.  MUSCULOSKELETAL: No swelling, clubbing, or edema.  NEUROLOGIC: obtunded,sedated SKIN:normal, warm to touch, Capillary refill delayed  Pulses present bilaterally  Labs/imaging that I havepersonally reviewed  (right click and "Reselect all SmartList Selections" daily)       ASSESSMENT AND PLAN  SYNOPSIS  76 YO WHITE FEMALE WITH METASTATIC BREAST CANCER ON IMMUNOTHERAPY WITH PROGRESSIVE HYPOXIA AND RESP FAILURE, ETIOLOGIES INCLUDE DRUG INDUCED TOXICITY FROM IMMUNOTHERAPY, METASTATIC BEAST CANCER, INFECTIOUS CAUSES(ATYPICAL, PCP) PULM EDEMA LEADING TO SEVERE RESP FAILURE REQUIRING MV SUPPORT  Severe ACUTE Hypoxic and Hypercapnic Respiratory Failure -continue Mechanical Ventilator support -Wean Fio2 and PEEP as tolerated -VAP/VENT bundle implementation - Wean PEEP & FiO2 as tolerated, maintain SpO2 > 88% - Head of bed elevated 30 degrees, VAP protocol in place - Plateau pressures less than 30 cm H20  - Intermittent chest x-ray & ABG PRN - Ensure adequate pulmonary hygiene  UNable to perform SAT/SBT   CONSIDER LASIX THERAPY as BP tolerates Continue IV STEROIDS NEBS Vent Mode: PRVC FiO2 (%):  [80 %-100 %] 90 % Set Rate:  [18 bmp] 18 bmp Vt Set:  [450 mL] 450 mL PEEP:  [5 cmH20-8 cmH20] 5 cmH20 Plateau Pressure:  [23 cmH20-25 cmH20] 23 cmH20   CARDIAC ICU monitoring   RENAL -continue Foley Catheter-assess need -Avoid nephrotoxic agents -Follow urine output, BMP -Ensure adequate renal perfusion, optimize oxygenation -Renal dose medications   Intake/Output Summary (Last 24 hours) at 03/21/2022 1725 Last data filed at 03/21/2022 1700 Gross per 24 hour  Intake 3382.51 ml  Output 1600 ml  Net 1782.51 ml       INFECTIOUS DISEASE -continue antibiotics as prescribed -follow up cultures PLAN FOR BRONCH TODAY  NUTRITIONAL STATUS DIET-->as tolerated Constipation  protocol as indicated  ENDO - ICU hypoglycemic\Hyperglycemia protocol -check FSBS per protocol   GI GI PROPHYLAXIS as indicated  NUTRITIONAL STATUS DIET-->TF's as tolerated Constipation protocol as indicated   ELECTROLYTES -follow labs as needed -replace as needed -pharmacy consultation and following   Best practice (right click and "Reselect all SmartList Selections" daily)     Best practice     Diet:TF's Pain/Anxiety/Delirium protocol (if indicated): Yes (RASS goal -2) VAP protocol (if indicated): Yes DVT prophylaxis: LMWH GI prophylaxis: H2B Glucose control:  SSI Yes Central venous access:  PICC LINE PLACED 1/10 Arterial line:  N/A Foley:  Yes, and it is still needed Mobility:  bed rest  Code Status:  DNR Disposition:ICU    Labs   CBC: Recent Labs  Lab 03/17/22 0539 03/18/22 0605 03/19/22 0427 03/20/22 0545 03/21/22 0206  WBC 8.3 9.4 11.3* 10.7* 11.7*  NEUTROABS  --   --   --  9.3*  --   HGB 10.5* 11.0* 10.4* 9.8* 9.9*  HCT 31.2* 33.1* 32.4* 30.4* 33.4*  MCV 91.5 91.2 94.2 95.9 100.6*  PLT 188 191 188 134* 145*     Basic Metabolic Panel: Recent Labs  Lab 03/17/22 0539 03/18/22 0605 03/19/22 0427 03/20/22 0545 03/21/22 0206  NA 137 137 136 132* 138  K 3.2* 3.9 3.7 3.7 5.1  CL 101 101 98 99 102  CO2 '27 25 29 26 30  '$ GLUCOSE 131* 136* 182* 245* 197*  BUN 18 19 25* 24* 51*  CREATININE 0.92 0.85 0.97 0.69 1.23*  CALCIUM 8.9 8.9 8.7* 7.6* 8.1*  MG 2.1 2.3 2.3 2.2  --   PHOS 2.9 2.8 2.7 2.3* 4.7*  4.7*    GFR: Estimated Creatinine Clearance: 45.9 mL/min (A) (by C-G formula based on SCr of 1.23 mg/dL (H)). Recent Labs  Lab 03/16/22 1213 03/17/22 0539 03/18/22 0605 03/19/22 0427 03/19/22 1619 03/20/22 0545 03/21/22 0206  PROCALCITON 0.43 0.50 0.30  --   --   --   --   WBC  --  8.3 9.4 11.3*  --  10.7* 11.7*  LATICACIDVEN  --   --   --   --  1.6  --   --      Liver Function Tests: Recent Labs  Lab 03/15/22 0411  03/16/22 0613 03/17/22 0539 03/18/22 0605 03/19/22 0427 03/21/22 0206  AST 34 24  --   --   --   --   ALT 40 32  --   --   --   --   ALKPHOS 92 121  --   --   --   --   BILITOT 0.7 0.8  --   --   --   --   PROT 5.8* 6.0*  --   --   --   --   ALBUMIN 2.3* 2.3* 2.3* 2.4* 2.3* 2.1*    No results for input(s): "LIPASE", "AMYLASE" in the last 168 hours. No results for input(s): "AMMONIA" in the last 168 hours.  ABG    Component Value Date/Time   PHART 7.29 (L) 03/18/2022 1100   PCO2ART 61 (H) 03/18/2022 1100   PO2ART 87 03/18/2022 1100   HCO3 29.3 (H) 03/18/2022 1100   O2SAT 96.6 03/18/2022 1100      DVT/GI PRX  assessed I Assessed the need for Labs I Assessed the need for Foley I Assessed the need for Central Venous Line Family Discussion when available I Assessed the need for Mobilization I made an Assessment of medications to be adjusted accordingly Safety Risk assessment completed  CASE DISCUSSED IN MULTIDISCIPLINARY ROUNDS WITH ICU TEAM     Critical care provider statement:   Total critical care time: 33 minutes   Performed by: Lanney Gins MD   Critical care time was exclusive of separately billable procedures and treating other patients.   Critical care was necessary to treat or prevent imminent or life-threatening deterioration.   Critical care was time spent personally by me on the following activities: development of treatment plan with patient and/or surrogate as well as nursing, discussions with consultants, evaluation of patient's response to treatment, examination of patient, obtaining history from patient or surrogate, ordering and performing treatments and interventions, ordering and review of laboratory studies, ordering and review of radiographic studies, pulse oximetry and re-evaluation of patient's condition.    Ottie Glazier, M.D.  Pulmonary & Critical Care Medicine

## 2022-03-21 NOTE — Progress Notes (Signed)
Pharmacy Antibiotic Note  Erica Waters is a 77 y.o. female admitted on 03/28/2022 with pneumonia. PMH significant for PVD, HLD, HTN, AF (on Eliquis), breast cancer with metastasis to R axillary lymph nodes, MDD, former smoker. Patient has documented penicillin allergy without documentation of previously tolerating beta-lactams. Morning of 1/10, patient having more trouble breathing and opted for intubation. Pharmacy has been consulted for vancomycin and meropenem dosing.    Plan: Day 8 of antibiotics Patient with new AKI this AM, discontinue scheduled vancomycin and check afternoon random level. Change meropenem dose from 1 g IV Q8H to 1 g IV Q12H  Continue to monitor renal function and follow culture results   Height: 5' 5"  (165.1 cm) Weight: 98.3 kg (216 lb 11.4 oz) IBW/kg (Calculated) : 57  Temp (24hrs), Avg:97.6 F (36.4 C), Min:96.9 F (36.1 C), Max:97.9 F (36.6 C)  Recent Labs  Lab 03/17/22 0539 03/18/22 0605 03/19/22 0427 03/19/22 1619 03/20/22 0545 03/21/22 0206  WBC 8.3 9.4 11.3*  --  10.7* 11.7*  CREATININE 0.92 0.85 0.97  --  0.69 1.23*  LATICACIDVEN  --   --   --  1.6  --   --      Estimated Creatinine Clearance: 45.9 mL/min (A) (by C-G formula based on SCr of 1.23 mg/dL (H)).    Allergies  Allergen Reactions   Penicillin G Hives and Shortness Of Breath    Antimicrobials this admission: 1/5 Levofloxacin >> 1/11 1/9 Bactrim >> 1/11 1/11 Vancomycin >> 1/11 Meropenem >>  Dose adjustments this admission: 1/7 Levofloxacin 750 mg Q48H >> Levofloxacin 750 mg Q24H 1/12 Vancomycin 1250 mg IV Q24H >> Vancomycin 1500 mg IV Q24H  1/13 Meropenem 1 g IV Q8H >> Meropenem 1 g IV Q12H   Microbiology results: 1/5 BCx: NG final 1/7 BCx: NG final 1/8 MRSA PCR: negative 1/10 Sputum: IP 1/11 BAL Cx: IP  Thank you for allowing pharmacy to be a part of this patient's care.  Gretel Acre, PharmD PGY1 Pharmacy Resident 03/21/2022 8:19 AM

## 2022-03-22 LAB — RENAL FUNCTION PANEL
Albumin: 1.8 g/dL — ABNORMAL LOW (ref 3.5–5.0)
Anion gap: 9 (ref 5–15)
BUN: 98 mg/dL — ABNORMAL HIGH (ref 8–23)
CO2: 24 mmol/L (ref 22–32)
Calcium: 7.8 mg/dL — ABNORMAL LOW (ref 8.9–10.3)
Chloride: 106 mmol/L (ref 98–111)
Creatinine, Ser: 2.97 mg/dL — ABNORMAL HIGH (ref 0.44–1.00)
GFR, Estimated: 16 mL/min — ABNORMAL LOW (ref >60)
Glucose, Bld: 129 mg/dL — ABNORMAL HIGH (ref 70–99)
Phosphorus: 8.4 mg/dL — ABNORMAL HIGH (ref 2.5–4.6)
Potassium: 6.8 mmol/L (ref 3.5–5.1)
Sodium: 139 mmol/L (ref 135–145)

## 2022-03-22 LAB — CBC
HCT: 28.4 % — ABNORMAL LOW (ref 36.0–46.0)
Hemoglobin: 8.3 g/dL — ABNORMAL LOW (ref 12.0–15.0)
MCH: 30.2 pg (ref 26.0–34.0)
MCHC: 29.2 g/dL — ABNORMAL LOW (ref 30.0–36.0)
MCV: 103.3 fL — ABNORMAL HIGH (ref 80.0–100.0)
Platelets: 168 10*3/uL (ref 150–400)
RBC: 2.75 MIL/uL — ABNORMAL LOW (ref 3.87–5.11)
RDW: 17 % — ABNORMAL HIGH (ref 11.5–15.5)
WBC: 10.4 10*3/uL (ref 4.0–10.5)
nRBC: 6.1 % — ABNORMAL HIGH (ref 0.0–0.2)

## 2022-03-22 LAB — TRIGLYCERIDES: Triglycerides: 105 mg/dL (ref <150)

## 2022-03-22 LAB — GLUCOSE, CAPILLARY: Glucose-Capillary: 125 mg/dL — ABNORMAL HIGH (ref 70–99)

## 2022-03-22 LAB — ACID FAST SMEAR (AFB, MYCOBACTERIA): Acid Fast Smear: NEGATIVE

## 2022-03-22 LAB — VANCOMYCIN, RANDOM: Vancomycin Rm: 24 ug/mL

## 2022-03-22 MED ORDER — MORPHINE 100MG IN NS 100ML (1MG/ML) PREMIX INFUSION
1.0000 mg/h | INTRAVENOUS | Status: DC
  Filled 2022-03-22: qty 100

## 2022-03-22 MED ORDER — LORAZEPAM 2 MG/ML IJ SOLN: 1.0000 mg | INTRAMUSCULAR | Status: DC

## 2022-03-22 MED ORDER — ALBUTEROL SULFATE (2.5 MG/3ML) 0.083% IN NEBU: 2.5000 mg | INHALATION_SOLUTION | RESPIRATORY_TRACT | Status: DC | PRN

## 2022-03-22 MED ORDER — STERILE WATER FOR INJECTION IJ SOLN
INTRAMUSCULAR | Status: AC
  Filled 2022-03-22: qty 10

## 2022-03-22 MED ORDER — FENTANYL BOLUS VIA INFUSION: 100.0000 ug | INTRAVENOUS | Status: DC | PRN

## 2022-03-22 MED ORDER — SODIUM CHLORIDE 0.9 % IV BOLUS
1000.0000 mL | Freq: Once | INTRAVENOUS | Status: AC
  Administered 2022-03-22: 1000 mL via INTRAVENOUS

## 2022-03-22 MED ORDER — GLYCOPYRROLATE 0.2 MG/ML IJ SOLN: 0.1000 mg | INTRAMUSCULAR | Status: DC

## 2022-03-22 MED ORDER — FENTANYL 2500MCG IN NS 250ML (10MCG/ML) PREMIX INFUSION
0.0000 ug/h | INTRAVENOUS | Status: DC
  Administered 2022-03-22: 350 ug/h via INTRAVENOUS

## 2022-03-22 MED ORDER — MIDAZOLAM HCL 2 MG/2ML IJ SOLN
2.0000 mg | INTRAMUSCULAR | Status: DC | PRN
  Administered 2022-03-22: 4 mg via INTRAVENOUS
  Filled 2022-03-22: qty 4

## 2022-03-22 MED ORDER — IPRATROPIUM-ALBUTEROL 0.5-2.5 (3) MG/3ML IN SOLN: 3.0000 mL | Freq: Four times a day (QID) | RESPIRATORY_TRACT | Status: DC

## 2022-03-22 MED ORDER — POLYVINYL ALCOHOL 1.4 % OP SOLN: 1.0000 [drp] | Freq: Four times a day (QID) | OPHTHALMIC | Status: DC | PRN

## 2022-03-22 MED ORDER — LORAZEPAM 2 MG/ML IJ SOLN
INTRAMUSCULAR | Status: AC
  Administered 2022-03-22: 1 mg via INTRAVENOUS
  Filled 2022-03-22: qty 1

## 2022-03-22 MED ORDER — SODIUM CHLORIDE 0.9 % IV SOLN: INTRAVENOUS | Status: DC

## 2022-03-22 MED ORDER — GLYCOPYRROLATE 1 MG PO TABS: 1.0000 mg | ORAL_TABLET | ORAL | Status: DC | PRN

## 2022-03-22 MED ORDER — GLYCOPYRROLATE 0.2 MG/ML IJ SOLN: 0.2000 mg | INTRAMUSCULAR | Status: DC | PRN

## 2022-03-23 LAB — ASPERGILLUS ANTIGEN, BAL/SERUM: Aspergillus Ag, BAL/Serum: 0.26 Index (ref 0.00–0.49)

## 2022-03-24 LAB — CULTURE, BAL-QUANTITATIVE W GRAM STAIN

## 2022-03-25 LAB — HYPERSENSITIVITY PNEUMONITIS
A. Pullulans Abs: NEGATIVE
A.Fumigatus #1 Abs: NEGATIVE
Micropolyspora faeni, IgG: NEGATIVE
Pigeon Serum Abs: NEGATIVE
Thermoact. Saccharii: NEGATIVE
Thermoactinomyces vulgaris, IgG: NEGATIVE

## 2022-03-26 LAB — PNEUMOCYSTIS PCR: Result Pneumocystis PCR: NEGATIVE

## 2022-03-30 LAB — MISC LABCORP TEST (SEND OUT): Labcorp test code: 832599

## 2022-04-09 NOTE — Progress Notes (Signed)
Pt extubated to comfort care.  

## 2022-04-09 NOTE — Death Summary Note (Signed)
   Death Summary   Erica Waters MKL:[id number] DOB: 06-22-1945 DOA: Mar 26, 2022  PCP: Derinda Late, MD  Admit date: 26-Mar-2022 Date of Death: 2022-04-04 Time of death: 3 am Final Diagnoses:  Principal Problem:   CAP (community acquired pneumonia) Active Problems:   AKI (acute kidney injury) ([place])   Metabolic acidosis   Hyponatremia   Acute respiratory failure with hypoxia (HCC)   Atrial fibrillation, chronic (HCC)   Breast cancer metastasized to axillary lymph node, right (HCC)   Essential hypertension   Pneumonia of both lungs due to infectious organism   Palliative care encounter    History of present illness:   77 y.o. 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 and a former smoker who presents to the ED from urgent care for evaluation of hypoxia/SOB.       She presented there with a 2-week history of a dry cough and shortness of breath and generalized malaise and was found to be hypoxic to the 60s.  She was placed on O2 at 3 L with improvement to the 90s and sent to the ED.      Hospital Course:   ED course and data review: afebrile with pulse in the 70s to 80s, respirations 22, BP 112/49 and O2 sat initially 87% on 3 L improving to high 90s on 4 L.   wBC 8200 with lactic acid 1.4.   Respiratory viral panel negative for COVID flu and RSV.   Troponin 21 and BNP 27.   BMP significant for creatinine of 1.46 above baseline of 0.98, with bicarb of 18 and sodium 128.     CTA chest negative for PE and showing multifocal pneumonia versus metastatic disease.  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   Patient was started on Levaquin due to history of severe penicillin allergy and given an IV fluid bolus of NS    1/8 PCCM CONSULTED FOR SEVERE HYPOXIA AND WOB PLACED ON BIPAP 03/20/22- patient on 100% FiO2, I met with husband today and  reviewed hospital course, current findings and medical plan. Further we discussed goals of care.  Currently she is with very poor prognosis with anticipation of in hospital death.  2022/04/03- patient remains critically ill. Family making plans to come in prior to terminal weaning from ventilatory. 2022/04/04- family came in for meeting and we have disccussed very poor prognosis with anticipated on hospital death.  Family wishes are to liberate from ventilatory and place patient of full comfort care measures.      Signed:    Ottie Glazier, M.D.  Pulmonary & [place]

## 2022-04-09 NOTE — IPAL (Addendum)
Interdisciplinary Goals of Care Family Meeting     Date carried out: April 10, 2022 Location of the meeting: Telephone conference   Member's involved: NP and Family Member or next of kin; Patient's husband Erica Waters   Durable Power of Attorney or acting medical decision maker:  Erica Waters   Discussion:  Advance Care Planning/Goals of Care discussion was performed during the course of treatment to decide on type of care right for this patient following change in patient's clinical status.   I talked with patient's husband in details following change in patient's current status. Reviewed patient's worsening  vital signs including unstable HR and blood pressure requiring multiple pressors, increased oxygen requirement and overall poor prognosis with multiorgan failure. I answered all his questions to the best of my knowledge.   Discussed prognosis, expected outcome with or without ongoing aggressive treatments and the options for de-escalation of care.   Diagnosis(es): Acute hypoxic hypercapnic respiratory failure secondary multifocal pneumonia and metastatic adenocarcinoma. Prognosis: Poor Code Status: DNR Disposition: ICU Next Steps:  Family understands the situation. They have consented and agreed to DNR/DNI and would not wish to pursue any aggressive treatment.  Patient's family would like to proceed with full comfort care including terminal extubation as soon as they arrive at the bedside. Patient's husband has requested  that we initiate comfort care measures " please make her comfortable  and do not continue anything that would potentially prolong her suffering until we get there to pull the plug". He would like to keep patient intubated until they get to the bedside this morning.    Family are satisfied with Plan of action and management. All questions answered     Total Time Spent Face to Face addressing advance care planning in the presence of the Patient: 35 minutes       Rufina Falco, DNP, FNP-C, AGACNP-BC Acute Care Nurse Practitioner [facility] Pager: [phone] [place] [facility]

## 2022-04-09 NOTE — Progress Notes (Signed)
NAME:  Erica Waters, MRN:  [id number], DOB:  May 06, 1945, LOS: 9 ADMISSION DATE:  04/06/2022   BRIEF SYNOPSIS RESP DISTRESS  History of Present Illness:  77 y.o. 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 and a former smoker who presents to the ED from urgent care for evaluation of hypoxia/SOB.     She presented there with a 2-week history of a dry cough and shortness of breath and generalized malaise and was found to be hypoxic to the 60s.  She was placed on O2 at 3 L with improvement to the 90s and sent to the ED.    ED course and data review: afebrile with pulse in the 70s to 80s, respirations 22, BP 112/49 and O2 sat initially 87% on 3 L improving to high 90s on 4 L.   wBC 8200 with lactic acid 1.4.   Respiratory viral panel negative for COVID flu and RSV.   Troponin 21 and BNP 27.   BMP significant for creatinine of 1.46 above baseline of 0.98, with bicarb of 18 and sodium 128.    CTA chest negative for PE and showing multifocal pneumonia versus metastatic disease.  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   Patient was started on Levaquin due to history of severe penicillin allergy and given an IV fluid bolus of NS   1/8 PCCM CONSULTED FOR SEVERE HYPOXIA AND WOB PLACED ON BIPAP 03/20/22- patient on 100% FiO2, I met with husband today and reviewed hospital course, current findings and medical plan. Further we discussed goals of care.  Currently she is with very poor prognosis with anticipation of in hospital death.  2022-04-03- patient remains critically ill. Family making plans to come in prior to terminal weaning from ventilatory. 2022/04/04- family came in for meeting and we have disccussed very poor prognosis with anticipated on hospital death.  Family wishes are to liberate from ventilatory and place patient of full comfort care measures.   ONCOLOGY   Medical History   Oncology History Overview Note Denovo metastatic disease ER 99% PR 99% HER1 1+ FISH neg, to mediastinal LAD, stage IV Diagnosis: Screening mammo - abnormal R breast area 06/23/21 07/17/21 - diagnostic mammo - 3.1cm mass in R upper outer, abnormal R axillary LN 08/12/21 - Bx - c/w IDC, grade 2, LN bx + 09/24/21 - CT CAP concerning for hilar LAD and 2 pulmonary nodules c/f metastatic disease, bone scan negative 10/01/21 - bronchoscopy bx of mediastinal LN - metastatic adenocarcinoma c/w breast 10/17/21 - estradiol PET - ER+ disease in R breast tissue, pulmonary and bone lesions, R axillary, mediastinal and bilateral hilar adenopathy 11/06/21 - started letrozole, plan for abemaciclib start '50mg'$  BID  Significant Hospital Events: Including procedures, antibiotic start and stop dates in addition to other pertinent events   ADMITTED 1/5 FOR HYPOXIA AND SOB 1/8 PCCM CONSULTED FOR SEVERE HYPOXIA 1/9 severe hypoxia fio2 at 93% 1/10 fio2 at 83% severe SOB and hypoxia, emergently intubated      Micro Data:  COVID/FLU/RSV NEG   IMAGING - SEVERE BILATERAL PNEUMONIA   Antibiotics Given (last 72 hours)     Date/Time Action Medication Dose Rate   03/19/22 1250 New Bag/Given   vancomycin (VANCOREADY) IVPB 2000 mg/400 mL 2,000 mg 200 mL/hr   03/19/22 1512 New Bag/Given   meropenem (MERREM) 1 g in sodium chloride 0.9 % 100 mL IVPB 1 g 200 mL/hr   03/19/22  2212 New Bag/Given   meropenem (MERREM) 1 g in sodium chloride 0.9 % 100 mL IVPB 1 g 200 mL/hr   03/19/22 2259 New Bag/Given   vancomycin (VANCOREADY) IVPB 1250 mg/250 mL 1,250 mg 166.7 mL/hr   03/20/22 0520 New Bag/Given   meropenem (MERREM) 1 g in sodium chloride 0.9 % 100 mL IVPB 1 g 200 mL/hr   03/20/22 1332 New Bag/Given   meropenem (MERREM) 1 g in sodium chloride 0.9 % 100 mL IVPB 1 g 200 mL/hr   03/20/22 2123 New Bag/Given   meropenem (MERREM) 1 g in sodium chloride 0.9 % 100 mL IVPB 1 g 200 mL/hr   03/20/22 2215 New  Bag/Given   vancomycin (VANCOREADY) IVPB 1500 mg/300 mL 1,500 mg 150 mL/hr   03/21/22 0528 New Bag/Given   meropenem (MERREM) 1 g in sodium chloride 0.9 % 100 mL IVPB 1 g 200 mL/hr   03/21/22 2147 New Bag/Given   meropenem (MERREM) 1 g in sodium chloride 0.9 % 100 mL IVPB 1 g 200 mL/hr        EVENTS OVERNIGHT Vent Mode: PRVC FiO2 (%):  [90 %-100 %] 100 % Set Rate:  [18 bmp] 18 bmp Vt Set:  [450 mL] 450 mL PEEP:  [5 cmH20-8 cmH20] 8 cmH20 Plateau Pressure:  [18 cmH20-20 cmH20] 18 cmH20 SEVERE HYPOXIA ALI/ARDS UNABLE TO WEAN FROM VENT PLAN  FOR BRONCH TODAY       Latest Ref Rng & Units 31-Mar-2022    5:32 AM 03/21/2022    2:06 AM 03/20/2022    5:45 AM  BMP  Glucose 70 - 99 mg/dL 129  197  245   BUN 8 - 23 mg/dL 98  51  24   Creatinine 0.44 - 1.00 mg/dL 2.97  1.23  0.69   Sodium 135 - 145 mmol/L 139  138  132   Potassium 3.5 - 5.1 mmol/L 6.8  5.1  3.7   Chloride 98 - 111 mmol/L 106  102  99   CO2 22 - 32 mmol/L '24  30  26   '$ Calcium 8.9 - 10.3 mg/dL 7.8  8.1  7.6       Objective   Blood pressure (!) 77/52, pulse (!) 125, temperature (!) 102 F (38.9 C), temperature source Oral, resp. rate 18, height '5\' 5"'$  (1.651 m), weight 98.3 kg, SpO2 90 %.    Vent Mode: PRVC FiO2 (%):  [90 %-100 %] 100 % Set Rate:  [18 bmp] 18 bmp Vt Set:  [450 mL] 450 mL PEEP:  [5 cmH20-8 cmH20] 8 cmH20 Plateau Pressure:  [18 cmH20-20 cmH20] 18 cmH20   Intake/Output Summary (Last 24 hours) at 2022/03/31 0177 Last data filed at 03-31-2022 0900 Gross per 24 hour  Intake 4473.67 ml  Output 250 ml  Net 4223.67 ml    Filed Weights   03/19/22 0424 03/20/22 0500 03/21/22 0500  Weight: 93.7 kg 96.6 kg 98.3 kg      REVIEW OF SYSTEMS  PATIENT IS UNABLE TO PROVIDE COMPLETE REVIEW OF SYSTEMS DUE TO SEVERE CRITICAL ILLNESS   PHYSICAL EXAMINATION:  GENERAL:critically ill appearing, +resp distress EYES: Pupils equal, round, reactive to light.  No scleral icterus.  MOUTH: Moist mucosal membrane.  INTUBATED NECK: Supple.  PULMONARY: Lungs clear to auscultation, +rhonchi, +wheezing CARDIOVASCULAR: S1 and S2.  Regular rate and rhythm GASTROINTESTINAL: Soft, nontender, -distended. Positive bowel sounds.  MUSCULOSKELETAL: No swelling, clubbing, or edema.  NEUROLOGIC: obtunded,sedated SKIN:normal, warm to touch, Capillary refill delayed  Pulses present bilaterally  Labs/imaging that I havepersonally reviewed  (right click and "Reselect all SmartList Selections" daily)       ASSESSMENT AND PLAN SYNOPSIS  77 YO WHITE FEMALE WITH METASTATIC BREAST CANCER ON IMMUNOTHERAPY WITH PROGRESSIVE HYPOXIA AND RESP FAILURE, ETIOLOGIES INCLUDE DRUG INDUCED TOXICITY FROM IMMUNOTHERAPY, METASTATIC BEAST CANCER, INFECTIOUS CAUSES(ATYPICAL, PCP) PULM EDEMA LEADING TO SEVERE RESP FAILURE REQUIRING MV SUPPORT  Severe ACUTE Hypoxic and Hypercapnic Respiratory Failure -continue Mechanical Ventilator support -Wean Fio2 and PEEP as tolerated -VAP/VENT bundle implementation - Wean PEEP & FiO2 as tolerated, maintain SpO2 > 88% - Head of bed elevated 30 degrees, VAP protocol in place - Plateau pressures less than 30 cm H20  - Intermittent chest x-ray & ABG PRN - Ensure adequate pulmonary hygiene  UNable to perform SAT/SBT   CONSIDER LASIX THERAPY as BP tolerates Continue IV STEROIDS NEBS Vent Mode: PRVC FiO2 (%):  [90 %-100 %] 100 % Set Rate:  [18 bmp] 18 bmp Vt Set:  [450 mL] 450 mL PEEP:  [5 cmH20-8 cmH20] 8 cmH20 Plateau Pressure:  [18 cmH20-20 cmH20] 18 cmH20   CARDIAC ICU monitoring   RENAL -continue Foley Catheter-assess need -Avoid nephrotoxic agents -Follow urine output, BMP -Ensure adequate renal perfusion, optimize oxygenation -Renal dose medications   Intake/Output Summary (Last 24 hours) at 2022/03/26 1751 Last data filed at March 26, 2022 0900 Gross per 24 hour  Intake 4473.67 ml  Output 250 ml  Net 4223.67 ml       INFECTIOUS DISEASE -continue antibiotics as  prescribed -follow up cultures PLAN FOR BRONCH TODAY  NUTRITIONAL STATUS DIET-->as tolerated Constipation protocol as indicated  ENDO - ICU hypoglycemic\Hyperglycemia protocol -check FSBS per protocol   GI GI PROPHYLAXIS as indicated  NUTRITIONAL STATUS DIET-->TF's as tolerated Constipation protocol as indicated   ELECTROLYTES -follow labs as needed -replace as needed -pharmacy consultation and following   Best practice (right click and "Reselect all SmartList Selections" daily)     Best practice     Diet:TF's Pain/Anxiety/Delirium protocol (if indicated): Yes (RASS goal -2) VAP protocol (if indicated): Yes DVT prophylaxis: LMWH GI prophylaxis: H2B Glucose control:  SSI Yes Central venous access:  PICC LINE PLACED 1/10 Arterial line:  N/A Foley:  Yes, and it is still needed Mobility:  bed rest  Code Status:  DNR Disposition:ICU    Labs   CBC: Recent Labs  Lab 03/18/22 0605 03/19/22 0427 03/20/22 0545 03/21/22 0206 03-26-22 0532  WBC 9.4 11.3* 10.7* 11.7* 10.4  NEUTROABS  --   --  9.3*  --   --   HGB 11.0* 10.4* 9.8* 9.9* 8.3*  HCT 33.1* 32.4* 30.4* 33.4* 28.4*  MCV 91.2 94.2 95.9 100.6* 103.3*  PLT 191 188 134* 145* 168     Basic Metabolic Panel: Recent Labs  Lab 03/17/22 0539 03/18/22 0605 03/19/22 0427 03/20/22 0545 03/21/22 0206 26-Mar-2022 0532  NA 137 137 136 132* 138 139  K 3.2* 3.9 3.7 3.7 5.1 6.8*  CL 101 101 98 99 102 106  CO2 '27 25 29 26 30 24  '$ GLUCOSE 131* 136* 182* 245* 197* 129*  BUN 18 19 25* 24* 51* 98*  CREATININE 0.92 0.85 0.97 0.69 1.23* 2.97*  CALCIUM 8.9 8.9 8.7* 7.6* 8.1* 7.8*  MG 2.1 2.3 2.3 2.2  --   --   PHOS 2.9 2.8 2.7 2.3* 4.7*  4.7* 8.4*    GFR: Estimated Creatinine Clearance: 19 mL/min (A) (by C-G formula based on SCr of 2.97 mg/dL (H)). Recent Labs  Lab 03/16/22 1213 03/17/22 0539  03/18/22 0605 03/19/22 0427 03/19/22 1619 03/20/22 0545 03/21/22 0206 2022-04-14 0532  PROCALCITON 0.43 0.50 0.30   --   --   --   --   --   WBC  --  8.3 9.4 11.3*  --  10.7* 11.7* 10.4  LATICACIDVEN  --   --   --   --  1.6  --   --   --      Liver Function Tests: Recent Labs  Lab 03/16/22 0613 03/17/22 0539 03/18/22 0605 03/19/22 0427 03/21/22 0206 04-14-22 0532  AST 24  --   --   --   --   --   ALT 32  --   --   --   --   --   ALKPHOS 121  --   --   --   --   --   BILITOT 0.8  --   --   --   --   --   PROT 6.0*  --   --   --   --   --   ALBUMIN 2.3* 2.3* 2.4* 2.3* 2.1* 1.8*    No results for input(s): "LIPASE", "AMYLASE" in the last 168 hours. No results for input(s): "AMMONIA" in the last 168 hours.  ABG    Component Value Date/Time   PHART 7.29 (L) 03/18/2022 1100   PCO2ART 61 (H) 03/18/2022 1100   PO2ART 87 03/18/2022 1100   HCO3 29.3 (H) 03/18/2022 1100   O2SAT 96.6 03/18/2022 1100      DVT/GI PRX  assessed I Assessed the need for Labs I Assessed the need for Foley I Assessed the need for Central Venous Line Family Discussion when available I Assessed the need for Mobilization I made an Assessment of medications to be adjusted accordingly Safety Risk assessment completed  CASE DISCUSSED IN MULTIDISCIPLINARY ROUNDS WITH ICU TEAM     Critical care provider statement:   Total critical care time: 33 minutes   Performed by: Lanney Gins MD   Critical care time was exclusive of separately billable procedures and treating other patients.   Critical care was necessary to treat or prevent imminent or life-threatening deterioration.   Critical care was time spent personally by me on the following activities: development of treatment plan with patient and/or surrogate as well as nursing, discussions with consultants, evaluation of patient's response to treatment, examination of patient, obtaining history from patient or surrogate, ordering and performing treatments and interventions, ordering and review of laboratory studies, ordering and review of radiographic studies, pulse  oximetry and re-evaluation of patient's condition.    Ottie Glazier, M.D.  Pulmonary & Critical Care Medicine

## 2022-04-09 NOTE — Progress Notes (Signed)
Patient's son and husband at bedside holding her hands. No breathing noted. Asystole per cardiac monitor. Attempted to auscultate for heart tones for 1 full minute each by this RN and Raphael Gibney, RN and no heart tones heard. Time of death 67. Dr. Lanney Gins aware.

## 2022-04-09 DEATH — deceased

## 2022-04-10 LAB — CULTURE, FUNGUS WITHOUT SMEAR

## 2022-05-03 LAB — ACID FAST CULTURE WITH REFLEXED SENSITIVITIES (MYCOBACTERIA): Acid Fast Culture: NEGATIVE

## 2024-01-19 IMAGING — MG US  BREAST BX W/ LOC DEV 1ST LESION IMG BX SPEC US GUIDE*R*
1 series · 7 of 8 positions shown · non-contrast
Comparison: Prior films
COMPARISON: Prior films

Addendum:
CLINICAL DATA: Abnormal right breast masses and abnormal right
axillary lymph node for biopsy

EXAM:
ULTRASOUND GUIDED right BREAST CORE NEEDLE BIOPSY

[Series 1: MG view · 0.07mm/px · 7 of 40 slices shown]
[im 1/40]
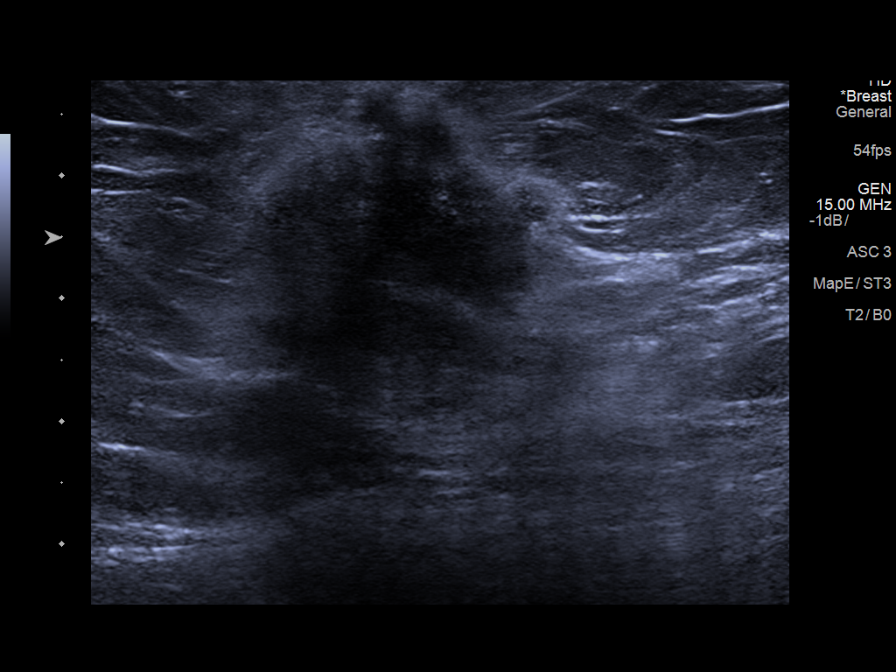
[im 6/40]
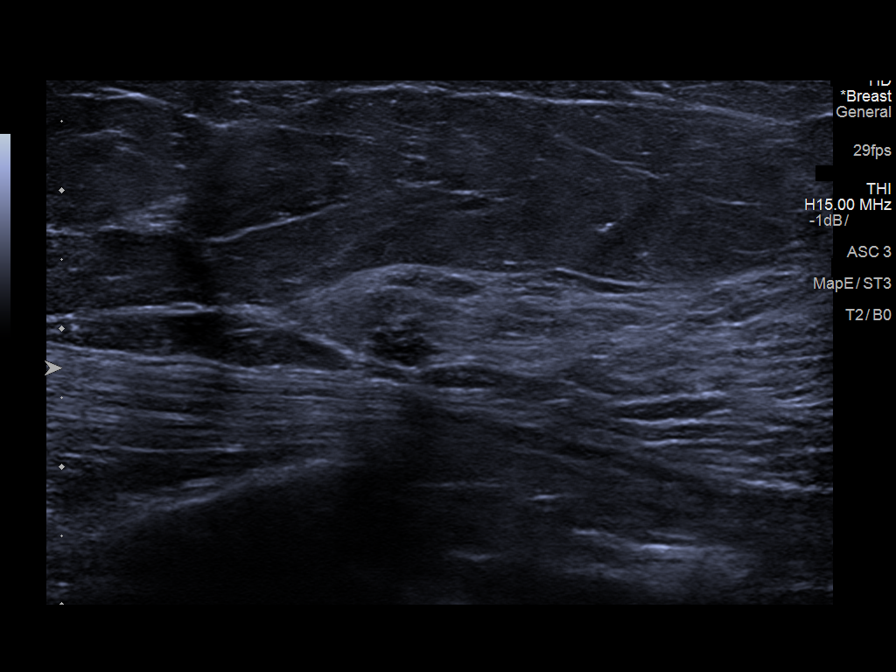
[im 12/40]
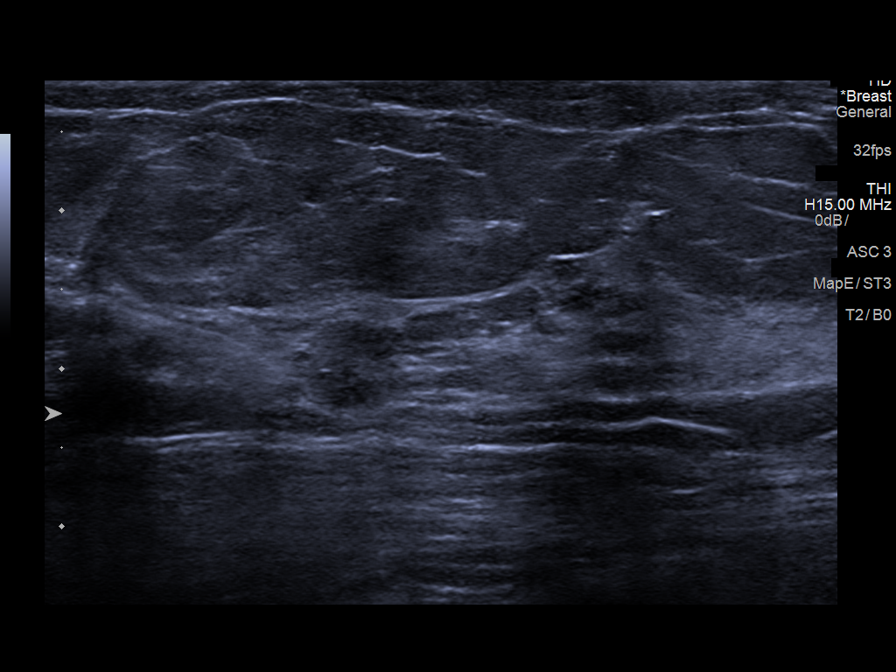
[im 17/40]
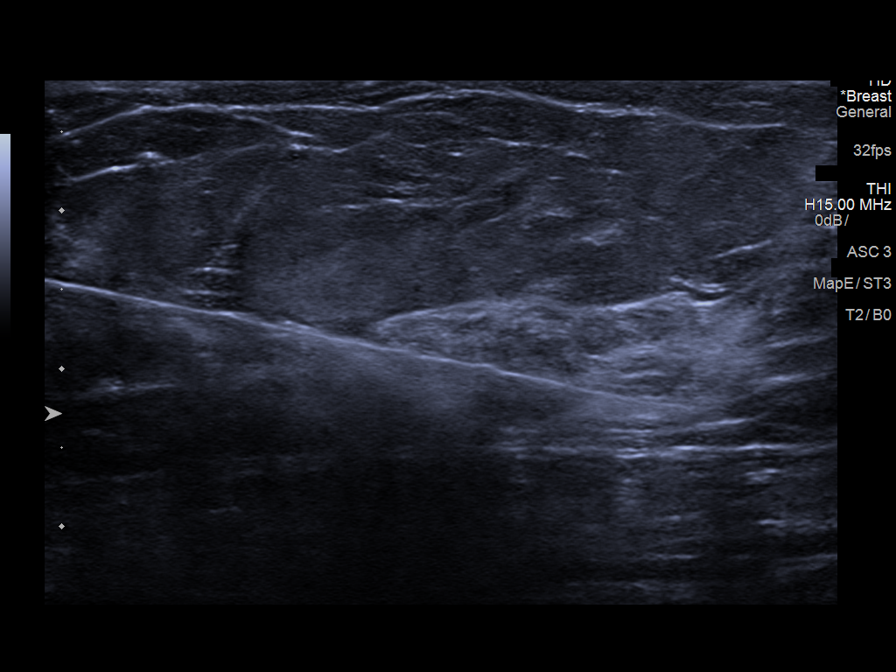
[im 23/40]
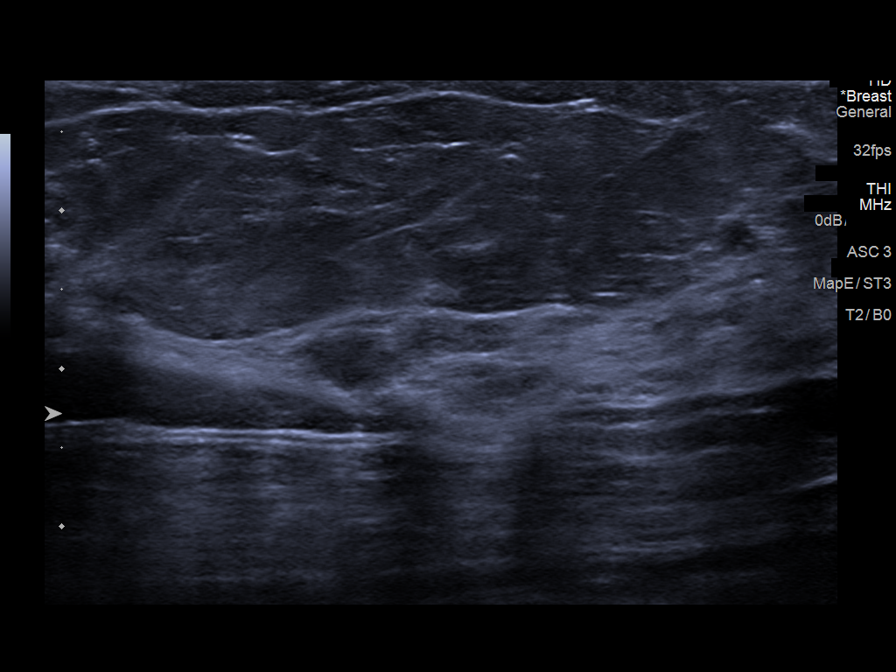
[im 28/40]
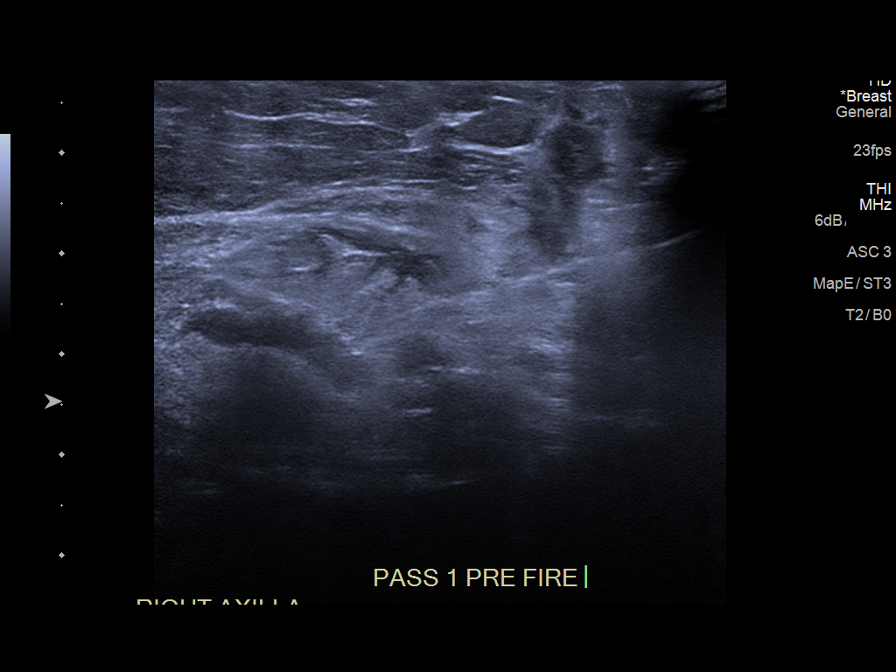
[im 34/40]
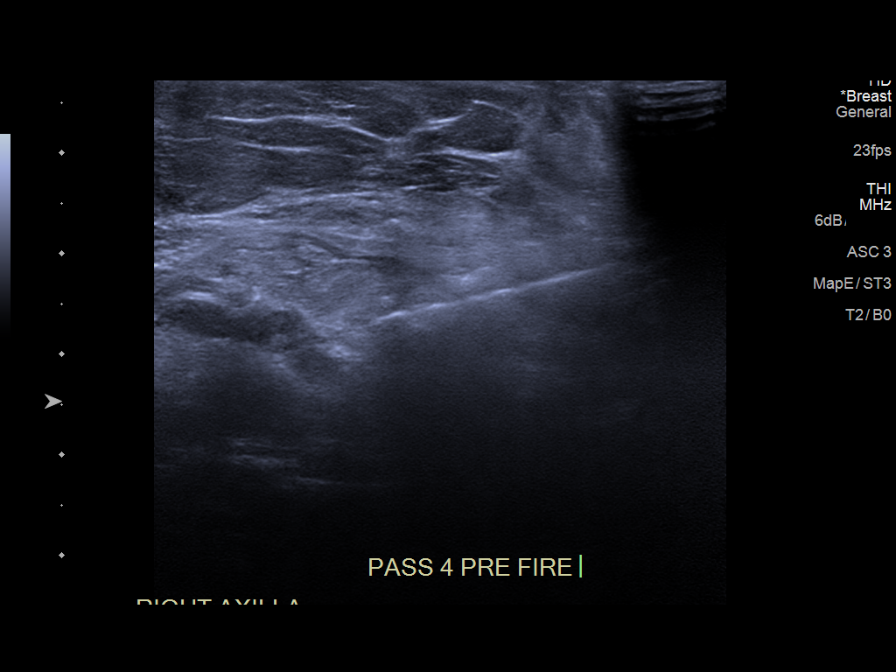

[7 of 8 positions shown; findings below may reference images not displayed]



1:Lesion quadrant: Upper-outer quadrant

Using sterile technique and 1% Lidocaine as local anesthetic, under
direct ultrasound visualization, a 12 gauge Kaki device was
used to perform biopsy of mass at right breast 10 o'clock using a
lateral approach. At the conclusion of the procedure coil shaped
tissue marker clip was deployed into the biopsy cavity. Follow up 2
view mammogram was performed and dictated separately.

2:Lesion quadrant: Right breast 12 o'clock

Using sterile technique and 1% Lidocaine as local anesthetic, under
direct ultrasound visualization, a 14 gauge Kaki device was
used to perform biopsy of mass at right breast 12 o'clock using a
lateral approach. At the conclusion of the procedure ribbon shaped
tissue marker clip was deployed into the biopsy cavity. Follow up 2
view mammogram was performed and dictated separately.

3:Lesion quadrant: Right axilla

Using sterile technique and 1% Lidocaine as local anesthetic, under
direct ultrasound visualization, a 14 gauge Kaki device was
used to perform biopsy of abnormal right axillary lymph node using a
medial approach. At the conclusion of the procedure HydroMARK type 3
shaped tissue marker clip was deployed into the biopsy cavity.
Follow up 2 view mammogram was performed and dictated separately.
IMPRESSION: Ultrasound guided biopsies of right breast masses and abnormal right
axillary lymph node. No apparent complications.

ADDENDUM:
Pathology revealed BREAST, RIGHT, [DATE]; CORE BIOPSIES: GRADE 2
INVASIVE MAMMARY CARCINOMA, NO SPECIAL TYPE. This was found to be
concordant by Dr. Winfred Uppal.

Pathology revealed BREAST, RIGHT, [DATE]; CORE BIOPSIES: ATYPICAL
COLUMNAR ALTERATIONS WITH APICAL SNOUTS AND SECRETIONS (FLAT
EPITHELIAL ATYPIA) - RARE ATYPICAL DUCTAL HYPERPLASIA. This was
found to be concordant by Dr. Winfred Uppal, with excision
recommended.

Pathology revealed RIGHT axillary lymph node; biopsies:- METASTATIC
MAMMARY CARCINOMA. Comment: The core biopsies of the right axillary
lymph node show metastatic carcinoma with features of mammary
carcinoma similar to those in the patient's breast core biopsies.
There is no residual lymph node tissue present, there are no benign
breast elements present, this is presumed to be biopsies area of
lymph node completely replaced by carcinoma-clinical correlation is
required. This was found to be concordant by Dr. Winfred Uppal.

Pathology results will be discussed with the patient by Dr. Ologo
Kartika, per his request. The patient reported doing well after the
biopsies with tenderness at the sites. Post biopsy instructions and
care were reviewed and questions were answered. The patient was
encouraged to call [facility]HOS[facility]]

Dr. Bao Tap Dimakis ([facility] RN of [[facility]]) will arrange
surgical and oncology referrals for patient. Reports and
recommendations faxed to [[facility]] [DATE].

Pathology results reported by Que RN on 08/14/2021.



1:Lesion quadrant: Upper-outer quadrant

Using sterile technique and 1% Lidocaine as local anesthetic, under
direct ultrasound visualization, a 12 gauge Que device was
used to perform biopsy of mass at right breast 10 o'clock using a
lateral approach. At the conclusion of the procedure coil shaped
tissue marker clip was deployed into the biopsy cavity. Follow up 2
view mammogram was performed and dictated separately.

2:Lesion quadrant: Right breast 12 o'clock

Using sterile technique and 1% Lidocaine as local anesthetic, under
direct ultrasound visualization, a 14 gauge [place] device was
used to perform biopsy of mass at right breast 12 o'clock using a
lateral approach. At the conclusion of the procedure ribbon shaped
tissue marker clip was deployed into the biopsy cavity. Follow up 2
view mammogram was performed and dictated separately.

3:Lesion quadrant: Right axilla

Using sterile technique and 1% Lidocaine as local anesthetic, under
direct ultrasound visualization, a 14 gauge Kaki device was
used to perform biopsy of abnormal right axillary lymph node using a
medial approach. At the conclusion of the procedure HydroMARK type 3
shaped tissue marker clip was deployed into the biopsy cavity.
Follow up 2 view mammogram was performed and dictated separately.
IMPRESSION: Ultrasound guided biopsies of right breast masses and abnormal right
axillary lymph node. No apparent complications.

## 2024-01-19 IMAGING — MG MM BREAST LOCALIZATION CLIP
6 series · 6 of 18 positions shown · non-contrast
Comparison: Previous exam(s).

CLINICAL DATA: Status post ultrasound-guided core biopsy for right
breast masses and abnormal right axillary lymph node

EXAM:
3D DIAGNOSTIC right MAMMOGRAM POST ULTRASOUND BIOPSY

[R MLO synth-2D]
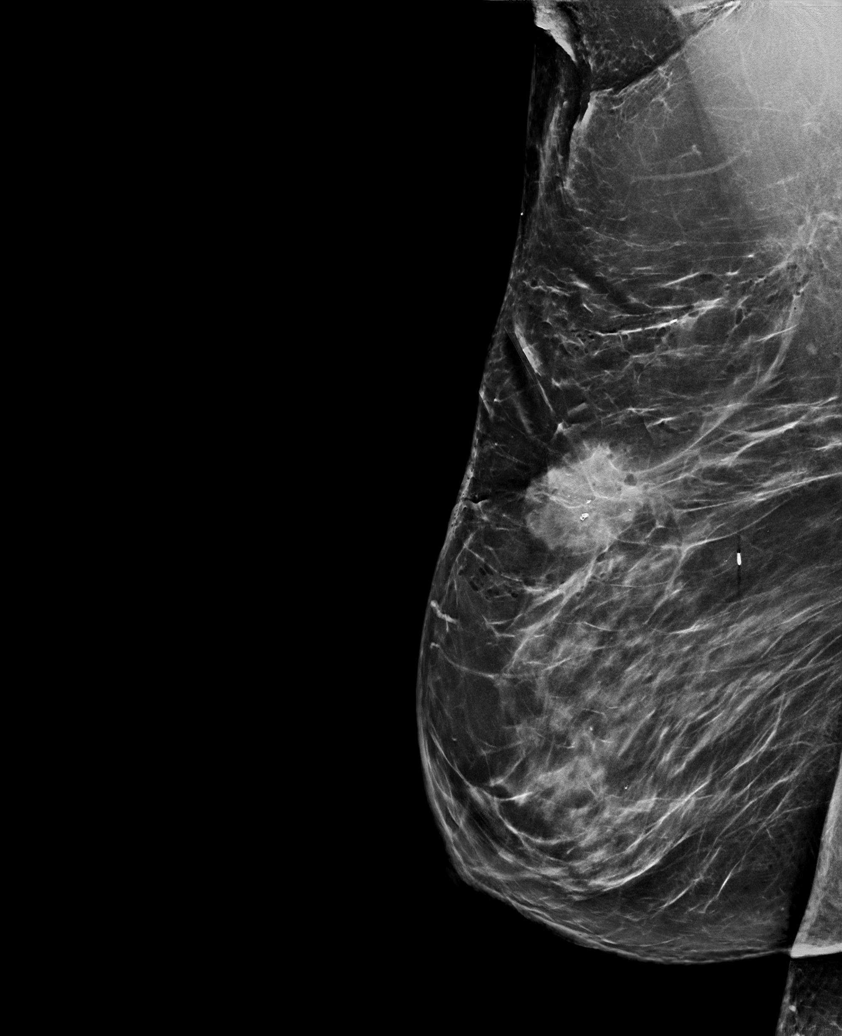

[R ML synth-2D]
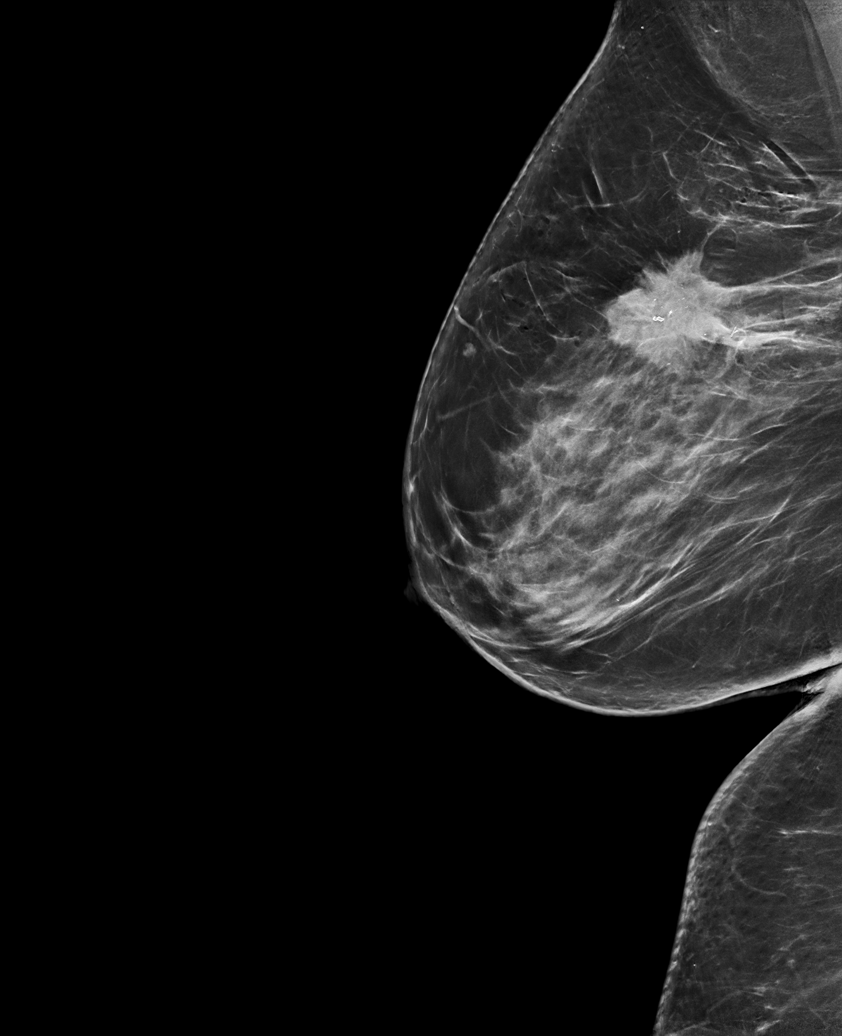

[R CC synth-2D]
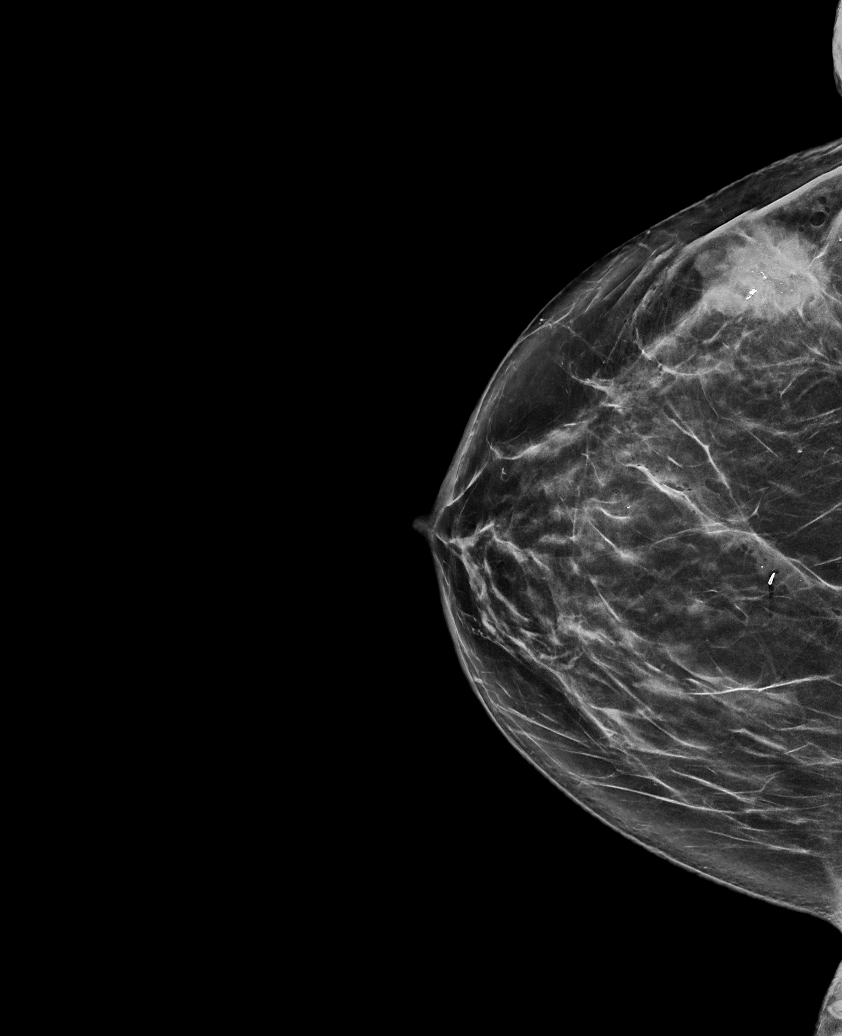

[R MLO tomo · tomo slice 47/93.0]
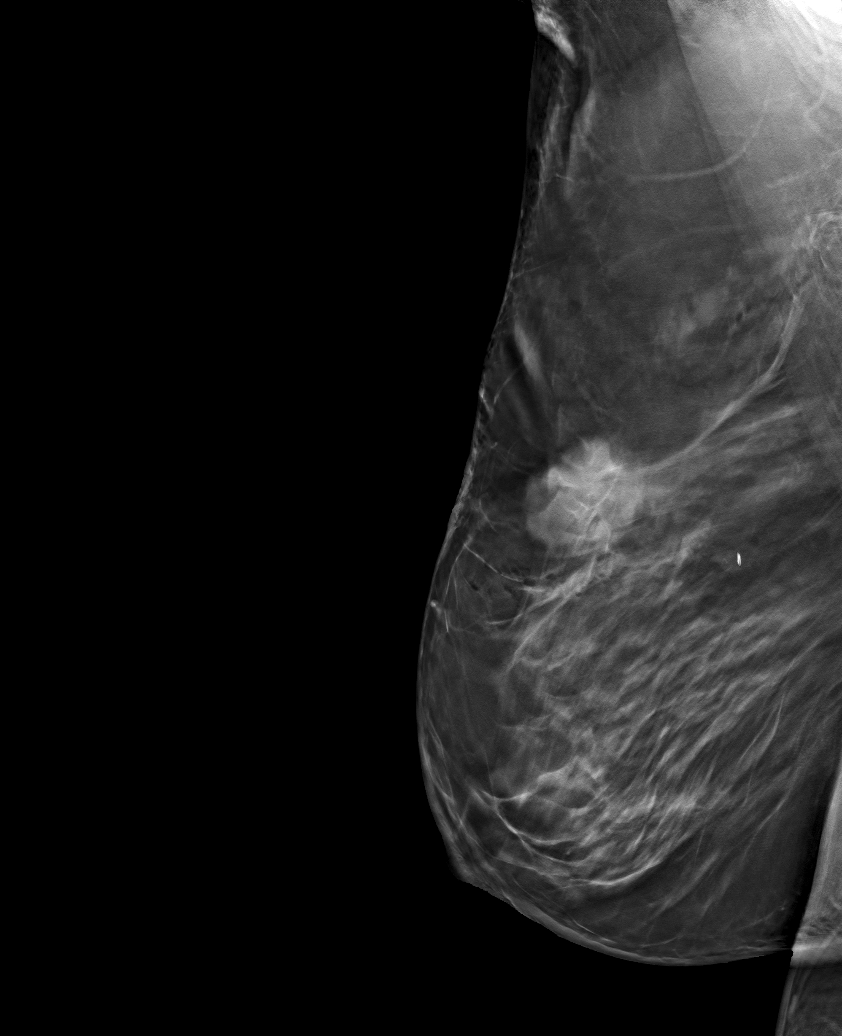

[R CC tomo · tomo slice 40/79.0]
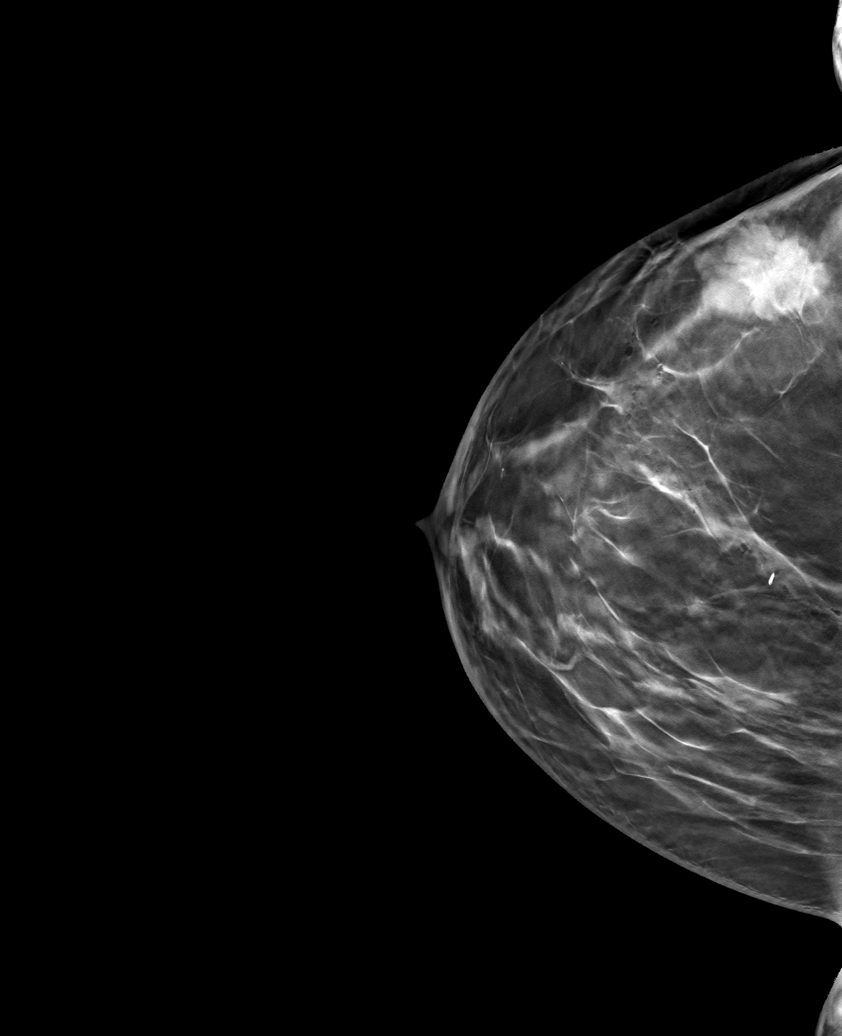

[R ML tomo · tomo slice 45/90.0]
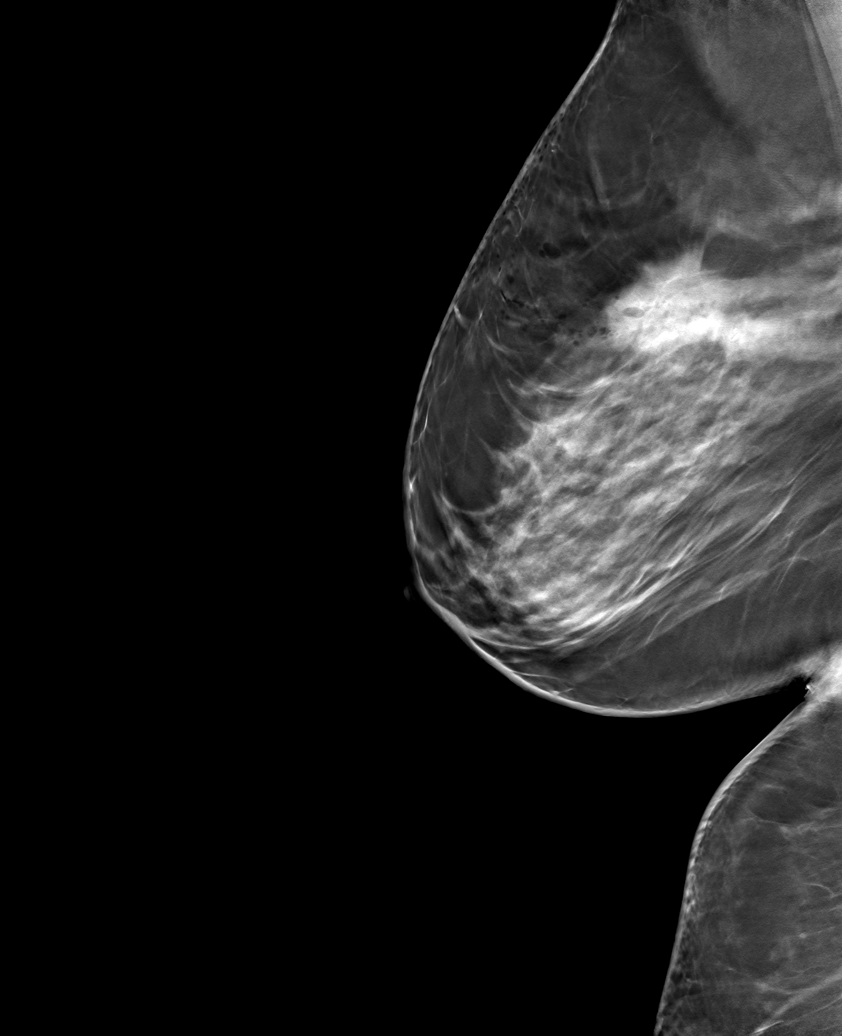

[6 of 18 positions shown; findings below may reference images not displayed]

FINDINGS: 1: 3D Mammographic images were obtained following ultrasound guided
biopsy of mass at right breast 10 o'clock. The biopsy marking clip
is in expected position at the site of biopsy.

2: 3D Mammographic images were obtained following ultrasound guided
biopsy of mass at right breast 12 o'clock. The biopsy marking clip
is in expected position at the site of biopsy. The ultrasound right
breast 12 o'clock mass correlates to the mass identified on the
mammogram.

3: 3D Mammographic images were obtained following ultrasound guided
biopsy of abnormal right axillary lymph node. The biopsy marking
clip is in expected position at the site of biopsy.
IMPRESSION: 1: Appropriate positioning of the coil shaped biopsy marking clip at
the site of biopsy in the mass at right breast 10 o'clock.

2: Appropriate positioning of the ribbon shaped biopsy marking clip
at the site of biopsy in the mass at right breast 12 o'clock.

3: Appropriate positioning of the HydroMARK type 3 shaped biopsy
marking clip at the site of biopsy in the abnormal right axillary
lymph node.

Final Assessment: Post Procedure Mammograms for Marker Placement
